# Patient Record
Sex: Male | Born: 1970 | Race: White | Hispanic: No | Marital: Married | Smoking: Never smoker
Health system: Southern US, Community
[De-identification: ages and names within clinical notes are randomized; demographics above are authoritative.]

## PROBLEM LIST (undated history)

## (undated) DIAGNOSIS — R5383 Other fatigue: Secondary | ICD-10-CM

## (undated) DIAGNOSIS — R0981 Nasal congestion: Secondary | ICD-10-CM

## (undated) DIAGNOSIS — G5600 Carpal tunnel syndrome, unspecified upper limb: Secondary | ICD-10-CM

## (undated) DIAGNOSIS — R0683 Snoring: Secondary | ICD-10-CM

## (undated) DIAGNOSIS — R002 Palpitations: Secondary | ICD-10-CM

## (undated) DIAGNOSIS — I10 Essential (primary) hypertension: Secondary | ICD-10-CM

## (undated) DIAGNOSIS — K219 Gastro-esophageal reflux disease without esophagitis: Secondary | ICD-10-CM

## (undated) HISTORY — DX: Carpal tunnel syndrome, unspecified upper limb: G56.00

## (undated) HISTORY — DX: Gastro-esophageal reflux disease without esophagitis: K21.9

## (undated) HISTORY — DX: Essential (primary) hypertension: I10

## (undated) HISTORY — DX: Snoring: R06.83

## (undated) HISTORY — DX: Nasal congestion: R09.81

## (undated) HISTORY — DX: Other fatigue: R53.83

## (undated) HISTORY — DX: Palpitations: R00.2

---

## 2018-12-24 ENCOUNTER — Ambulatory Visit (INDEPENDENT_AMBULATORY_CARE_PROVIDER_SITE_OTHER): Payer: Self-pay | Admitting: Cardiology

## 2019-01-04 ENCOUNTER — Other Ambulatory Visit: Payer: Self-pay | Admitting: Cardiology

## 2019-01-04 DIAGNOSIS — R0602 Shortness of breath: Secondary | ICD-10-CM

## 2019-01-13 ENCOUNTER — Ambulatory Visit
Admission: RE | Admit: 2019-01-13 | Discharge: 2019-01-13 | Disposition: A | Discharge status: Home / Self Care | Payer: BC Managed Care – PPO | Source: Ambulatory Visit | Attending: Cardiology | Admitting: Cardiology

## 2019-01-13 DIAGNOSIS — I493 Ventricular premature depolarization: Secondary | ICD-10-CM | POA: Insufficient documentation

## 2019-01-13 DIAGNOSIS — R0602 Shortness of breath: Secondary | ICD-10-CM

## 2019-01-13 DIAGNOSIS — I77810 Thoracic aortic ectasia: Secondary | ICD-10-CM | POA: Insufficient documentation

## 2019-01-13 DIAGNOSIS — R42 Dizziness and giddiness: Secondary | ICD-10-CM | POA: Insufficient documentation

## 2019-02-02 ENCOUNTER — Ambulatory Visit (INDEPENDENT_AMBULATORY_CARE_PROVIDER_SITE_OTHER): Payer: Self-pay | Admitting: Cardiovascular Disease

## 2020-11-20 LAB — HM COLONOSCOPY

## 2021-02-05 ENCOUNTER — Other Ambulatory Visit: Payer: Self-pay | Admitting: Gastroenterology

## 2021-03-14 NOTE — H&P (Signed)
[facility] OFFICE  [address], [place], [place] [address]     Anthony Mays    Date of Visit:  12/24/2018  Date of Birth: June 30, 1971  Age: 50 yrs.   Medical Record Number: [id number]  Referring Physician: Nolen Mu MD, [place] Lighter  __   CURRENT DIAGNOSES     1. 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 Shortness of breath, R06.02  3. Dizziness, vertigo, R42  __  ALLERGIES     No Known Allergies  __  MEDICATIONS     1. coenzyme Q10 10 mg capsule, 1 po qd  __   CHIEF COMPLAINT/REASON FOR VISIT  Dizziness, Palpitations and Shortness of breath  __  HISTORY OF PRESENT ILLNESS   Anthony Mays is a 50 year old gentleman, on whom we were asked to consult by Dr. Nolen Mu, for evaluation of shortness of breath and lightheadedness.     Anthony Mays had been seeing Dr. Quita Skye in [place], [place], for years. Primarily  this was done for palpitations. A Holter monitor did reveal PVCs. Workups including multiple stress echocardiograms have always been unremarkable. The last echocardiogram did reveal mildly dilated left ventricle, but overall normal left ventricular function.  Aortic root was also mildly enlarged.    Recently, Anthony Mays has been noticing some shortness of breath and lightheadedness when he tries to push himself during exercise. This actually began seven months ago. He has really stopped exercising  because of these symptoms. He will exercise and then feel lightheaded and short of breath. He will slow down or stop and the symptoms disappear. There is no pain per se. He has no paroxysmal nocturnal dyspnea or orthopnea. He does have occasional palpitations,  but they are much improved on magnesium oxide that was prescribed by Dr. Onalee Hua.  __  PAST HISTORY      Past Medical Illnesses: No previous history of significant medical illnesses.;  Past Cardiac Illnesses : No previous history of cardiac disease.; Infectious Diseases: No previous history of significant infectious diseases.;   Surgical Procedures: No previous surgical procedures.; Trauma History: No previous history of significant  trauma.; Cardiology Procedures-Invasive: No previous interventional or invasive cardiology procedures.;  Cardiology Procedures-Noninvasive: Holter Monitor December 2012, Echocardiogram December 2012, Echocardiogram August 2013, Echocardiogram January 2014, Stress Echocardiogram January 2013;  Left Ventricular Ejection Fraction: LVEF of 60% documented via echocardiogram on 01/05/2013; Peripheral Vascular Procedures : Carotid doppler 01/16/2012, LE arterial doppler 01/2012    __  CARDIAC RISK FACTORS      Tobacco Abuse: has never used tobacco; Family History of Heart Disease: no family history of cardiovascular  disease; Hyperlipidemia: negative; Hypertension: negative;   Diabetes Mellitus: negative; Prior History of Heart Disease : negative; Obesity: negative; Sedentary Life Style :negative; [id number]  __  SOCIAL HISTORY     Alcohol Use: Does not use alcohol; Smoking: Does not smoke; Never smoker ([id number]);  Diet: Regular diet; Exercise: No regular exercise;   __   REVIEW OF SYSTEMS    General: Denies recent weight  loss, weight gain, fever or chills or change in exercise tolerance.; Integumentary: Denies any change  in hair or nails, rashes, or skin lesions.; Eyes: Denies diplopia, glaucoma or visual field defects.;  Ears, Nose, Throat, Mouth: Denies any hearing loss, epistaxis, hoarseness or difficulty speaking.;Respiratory : Denies dyspnea, cough, wheezing or hemoptysis.; Cardiovascular:  Please review HPI; Abdominal : Denies ulcer disease, hematochezia or melena.; Musculoskeletal:Denies any venous insufficiency, arthritic symptoms or back problems.; Neurological  : Denies any recurrent strokes, TIA, or seizure disorder.;  Psychiatric:  Denies any depression, substance abuse or change in cognitive functions.; Endocrine: Denies any weight  change, heat/cold intolerance, polydipsia, or polyuria;  Hematologic/Immunologic: Denies any food allergies,  seasonal allergies, bleeding disorders.  __  PHYSICAL EXAMINATION    Vital Signs :  Blood Pressure:  124/68 Supine, Left arm, regular cuff  118/72 Sitting, Left arm, regular cuff  120/76 Standing, Left arm, regular cuff     Weight: 221.00 lbs.  Height: 74"  BMI:  28.37   Pulse: 63/min. ekg       Constitutional:  Cooperative, alert and oriented,well developed, well nourished, in no acute distress. Skin: warm and dry to touch  Head: normocephalic Eyes: conjunctivae and lids normal  ENT: No pallor or cyanosis Neck:  no JVD, no bruits Chest: clear to auscultation bilaterally Cardiac : Regular rhythm, S1 normal, S2 normal, No S3 or S4, Apical impulse not displaced, no murmurs, gallops or rubs detected. Abdomen: Abdomen soft, bowel sounds  normoactive, no masses, no hepatosplenomegaly, non-tender, no bruits Peripheral Pulses: The femoral,  popliteal, dorsalis pedis, and posterior tibial pulses are full and equal bilaterally with no bruits auscultated. Extremities/Back: No deformities, clubbing,  cyanosis, erythema or edema observed. There are no spinal abnormalities noted. Normal muscle strength and tone. Neurological:  No gross motor or sensory deficits noted, affect appropriate, oriented to time, person and place.   __    Medications added today by the physician:    ECG:   Normal.    IMPRESSION:  Vague lightheadedness and shortness of breath of uncertain etiology. I do wonder whether or not he was becoming increasingly  vagal towards peak exercise.     RECOMMENDATIONS:  1. Echocardiogram in view of his shortness of breath.  2. Treadmill stress test to see if  we can recreate any of his symptoms.   3. If these tests are unremarkable, he will be reassured. At that point however, we may need to consider more long-term monitoring if his symptoms persist.     Jodelle Gross, MD, [facility]    CMM/bjo     cc: Smith Mince MD  Quita Skye MD  ____________________________  TODAYS  ORDERS  2D, color flow, doppler 1 week  Diet mgmt edu, guidance  and counseling TODAY  Lipid Panel 1 week  12 Lead ECG Today  Exercise Treadmill Bruce 1 week

## 2021-08-28 ENCOUNTER — Other Ambulatory Visit (HOSPITAL_BASED_OUTPATIENT_CLINIC_OR_DEPARTMENT_OTHER): Payer: Self-pay

## 2021-08-28 ENCOUNTER — Ambulatory Visit: Payer: BC Managed Care – PPO | Attending: Internal Medicine

## 2021-08-28 DIAGNOSIS — Z23 Encounter for immunization: Secondary | ICD-10-CM

## 2021-08-28 MED ORDER — INFLUENZA VAC SPLIT QUAD 0.5 ML IM SUSY
PREFILLED_SYRINGE | INTRAMUSCULAR | 0 refills | Status: DC
  Filled 2021-08-28: qty 0.5, 1d supply, fill #0

## 2021-08-28 NOTE — Progress Notes (Signed)
   Covid-19 Vaccination Clinic  Name:  Macai Sisneros    MRN: [id number] DOB: 11-14-1971  08/28/2021  Mr. Wray was observed post Covid-19 immunization for 15 minutes without incident. He was provided with Vaccine Information Sheet and instruction to access the V-Safe system.   Mr. Lantzy was instructed to call 911 with any severe reactions post vaccine: Difficulty breathing  Swelling of face and throat  A fast heartbeat  A bad rash all over body  Dizziness and weakness

## 2021-08-30 ENCOUNTER — Telehealth: Payer: BC Managed Care – PPO | Admitting: Physician Assistant

## 2021-08-30 ENCOUNTER — Telehealth: Payer: Self-pay

## 2021-08-30 DIAGNOSIS — G8929 Other chronic pain: Secondary | ICD-10-CM

## 2021-08-30 DIAGNOSIS — B351 Tinea unguium: Secondary | ICD-10-CM

## 2021-08-30 DIAGNOSIS — M25562 Pain in left knee: Secondary | ICD-10-CM

## 2021-08-30 NOTE — Progress Notes (Signed)
Based on what you shared with me, I feel your condition warrants further evaluation and I recommend that you be seen in a face to face visit. E-visits are intended for simple acute visits and so what we can treat without examining you is limited.   With the ongoing back pain an d"popping" in lower back this could be muscular but concern for worsening of slipped discs giving the tingling in posterior thigh with this. Nerve compression in the spine can have an impact on urination and bowel movement -- either making it easier or harder to go, causing incontinence, etc when severe. As such you need further evaluation in person.   For the nail fungus. Topical treatments for this are overall unsuccessful and it usually takes a couple of months of daily oral medication to clear this up. This requires assessment of your liver enzymes to make sure it is safe to prescribe these medications. As such, this is something that a primary care provider or podiatrist would take care of. Casey Carson    NOTE: There will be NO CHARGE for this eVisit   If you are having a true medical emergency please call 911.      For an urgent face to face visit, [place] has six urgent care centers for your convenience:     [facility] at [facility] Directions [phone] [address] [place], [place] [address]    [facility] Health Urgent [facility] Center [facility]) Get Driving Directions [phone] [address] [place], [place] [address]  [facility] Urgent Care Center [facility]) Get Driving Directions [phone] [address] [place],  [place]  [address]  [facility] Health Urgent Care at [facility] Get Driving Directions [phone] [address] [place], [place] [address]   [facility] Health Urgent Care at [facility] Get Driving Directions  [phone] [address] [place], [place] [address]   [facility]  Urgent Care at [facility] Directions [phone] [address] [place], [place] [address]  Your MyChart E-visit questionnaire answers were reviewed by a board certified advanced clinical practitioner to complete your personal care plan based on your specific symptoms.  Thank you for using e-Visits.

## 2021-09-04 ENCOUNTER — Other Ambulatory Visit (HOSPITAL_BASED_OUTPATIENT_CLINIC_OR_DEPARTMENT_OTHER): Payer: Self-pay

## 2021-09-04 MED ORDER — COVID-19MRNA BIVAL VACC PFIZER 30 MCG/0.3ML IM SUSP
INTRAMUSCULAR | 0 refills | Status: DC
  Filled 2021-09-04: qty 0.3, 1d supply, fill #0

## 2021-09-12 ENCOUNTER — Encounter: Payer: Self-pay | Admitting: Family Medicine

## 2021-09-12 ENCOUNTER — Ambulatory Visit (INDEPENDENT_AMBULATORY_CARE_PROVIDER_SITE_OTHER): Payer: BC Managed Care – PPO | Admitting: Family Medicine

## 2021-09-12 VITALS — Ht 77.0 in | Wt 230.0 lb

## 2021-09-12 DIAGNOSIS — M5416 Radiculopathy, lumbar region: Secondary | ICD-10-CM | POA: Diagnosis not present

## 2021-09-12 MED ORDER — PREDNISONE 5 MG PO TABS: ORAL_TABLET | ORAL | 0 refills | Status: DC

## 2021-09-12 NOTE — Progress Notes (Signed)
  Casey Carson - 50 y.o. male MRN [id number]  Date of birth: [birth year]  SUBJECTIVE:  Including CC & ROS.  No chief complaint on file.   Casey Carson is a 50 y.o. male that is presenting with acute on chronic left leg pain.  It is occurring over the lateral aspect and goes to his foot.  No injury or inciting event.  Has tried ibuprofen.   Review of Systems See HPI   HISTORY: Past Medical, Surgical, Social, and Family History Reviewed & Updated per EMR.   Pertinent Historical Findings include:  History reviewed. No pertinent past medical history.  History reviewed. No pertinent surgical history.  History reviewed. No pertinent family history.  Social History   Socioeconomic History   Marital status: Married    Spouse name: Not on file   Number of children: Not on file   Years of education: Not on file   Highest education level: Not on file  Occupational History   Not on file  Tobacco Use   Smoking status: Not on file   Smokeless tobacco: Not on file  Substance and Sexual Activity   Alcohol use: Not on file   Drug use: Not on file   Sexual activity: Not on file  Other Topics Concern   Not on file  Social History Narrative   Not on file   Social Determinants of Health   Financial Resource Strain: Not on file  Food Insecurity: Not on file  Transportation Needs: Not on file  Physical Activity: Not on file  Stress: Not on file  Social Connections: Not on file  Intimate Partner Violence: Not on file     PHYSICAL EXAM:  VS: Ht 6\' 5"  (1.956 m)   Wt 230 lb (104.3 kg)   BMI 27.27 kg/m  Physical Exam Gen: NAD, alert, cooperative with exam, well-appearing    ASSESSMENT & PLAN:   Lumbar radiculopathy Symptoms seem most consistent with radicular type pain.  Has good range of motion and some instability with hip flexion and abduction. -Counseled on home exercise therapy and supportive care. -Prednisone. -X-ray. -Referral to physical  therapy. -Could consider gabapentin.

## 2021-09-12 NOTE — Assessment & Plan Note (Signed)
Symptoms seem most consistent with radicular type pain.  Has good range of motion and some instability with hip flexion and abduction. -Counseled on home exercise therapy and supportive care. -Prednisone. -X-ray. -Referral to physical therapy. -Could consider gabapentin.

## 2021-09-12 NOTE — Patient Instructions (Signed)
Nice to meet you Please try heat  Please try the exercises  Please try physical therapy  I will call with the xray results.   Please send me a message in MyChart with any questions or updates.  Please see me back in 4 weeks.   --Dr. Jordan Likes

## 2021-09-14 ENCOUNTER — Ambulatory Visit: Payer: BC Managed Care – PPO | Admitting: Podiatry

## 2021-09-14 ENCOUNTER — Other Ambulatory Visit: Payer: Self-pay

## 2021-09-14 ENCOUNTER — Ambulatory Visit (HOSPITAL_BASED_OUTPATIENT_CLINIC_OR_DEPARTMENT_OTHER)
Admission: RE | Admit: 2021-09-14 | Discharge: 2021-09-14 | Disposition: A | Discharge status: Home / Self Care | Payer: BC Managed Care – PPO | Source: Ambulatory Visit | Attending: Family Medicine | Admitting: Family Medicine

## 2021-09-14 DIAGNOSIS — M5117 Intervertebral disc disorders with radiculopathy, lumbosacral region: Secondary | ICD-10-CM | POA: Diagnosis not present

## 2021-09-14 DIAGNOSIS — M4726 Other spondylosis with radiculopathy, lumbar region: Secondary | ICD-10-CM | POA: Diagnosis not present

## 2021-09-14 DIAGNOSIS — M4696 Unspecified inflammatory spondylopathy, lumbar region: Secondary | ICD-10-CM | POA: Diagnosis not present

## 2021-09-14 DIAGNOSIS — M5116 Intervertebral disc disorders with radiculopathy, lumbar region: Secondary | ICD-10-CM | POA: Diagnosis not present

## 2021-09-14 DIAGNOSIS — M5416 Radiculopathy, lumbar region: Secondary | ICD-10-CM | POA: Diagnosis not present

## 2021-09-15 ENCOUNTER — Encounter: Payer: Self-pay | Admitting: Family Medicine

## 2021-09-15 ENCOUNTER — Ambulatory Visit: Payer: BC Managed Care – PPO | Admitting: Family Medicine

## 2021-09-18 ENCOUNTER — Telehealth: Payer: Self-pay | Admitting: Family Medicine

## 2021-09-18 NOTE — Telephone Encounter (Signed)
Informed of results.   Myra Rude, MD [facility] 09/18/2021, 11:48 AM

## 2021-09-19 ENCOUNTER — Other Ambulatory Visit: Payer: Self-pay

## 2021-09-19 ENCOUNTER — Ambulatory Visit (INDEPENDENT_AMBULATORY_CARE_PROVIDER_SITE_OTHER): Payer: BC Managed Care – PPO | Admitting: Podiatry

## 2021-09-19 ENCOUNTER — Encounter: Payer: Self-pay | Admitting: Podiatry

## 2021-09-19 DIAGNOSIS — B351 Tinea unguium: Secondary | ICD-10-CM

## 2021-09-19 MED ORDER — TERBINAFINE HCL 250 MG PO TABS: ORAL_TABLET | ORAL | 0 refills | Status: DC

## 2021-09-20 NOTE — Progress Notes (Signed)
Subjective:   Patient ID: Casey Carson, male   DOB: 50 y.o.   MRN: [id number]   HPI Patient presents stating he has had discoloration of his big toenails left over right times several months and also get some mild discomfort on the left big toe.  Admits he was quite active and that it may have occurred after some activity and patient does not currently smoke does like to be active   Review of Systems  All other systems reviewed and are negative.      Objective:  Physical Exam Vitals and nursing note reviewed.  Constitutional:      Appearance: He is well-developed.  Pulmonary:     Effort: Pulmonary effort is normal.  Musculoskeletal:        General: Normal range of motion.  Skin:    General: Skin is warm.  Neurological:     Mental Status: He is alert.    Neurovascular status intact muscle strength was found to be adequate range of motion adequate with slight distal yellowness and thickening of the hallux nail left over right but no proximal or mid nail involvement.  Patient has good digital perfusion no erythema edema or drainage noted     Assessment:  Possibility for low-grade fungal infection left big toenail over right versus trauma which may have occurred     Plan:  H&P reviewed both conditions.  I discussed trauma and at this point I have recommended that he start a low dose pulse antifungal therapy and allow it to grow out and if it becomes worse we will initiate laser or possible more aggressive oral antifungal therapy.  Placed on pulse pack and encouraged to call if symptoms do not get better over the next 4 to 6 months

## 2021-09-24 ENCOUNTER — Ambulatory Visit: Payer: BC Managed Care – PPO | Attending: Family Medicine | Admitting: Physical Therapy

## 2021-09-24 ENCOUNTER — Other Ambulatory Visit: Payer: Self-pay

## 2021-09-24 ENCOUNTER — Encounter: Payer: Self-pay | Admitting: Physical Therapy

## 2021-09-24 DIAGNOSIS — M62838 Other muscle spasm: Secondary | ICD-10-CM

## 2021-09-24 DIAGNOSIS — M5416 Radiculopathy, lumbar region: Secondary | ICD-10-CM | POA: Insufficient documentation

## 2021-09-24 DIAGNOSIS — M6281 Muscle weakness (generalized): Secondary | ICD-10-CM | POA: Diagnosis not present

## 2021-09-24 DIAGNOSIS — M5442 Lumbago with sciatica, left side: Secondary | ICD-10-CM

## 2021-09-24 NOTE — Therapy (Signed)
[facility] [address]  [address] [place], [place], [address] Phone: [phone]   Fax:  [phone]  Physical Therapy Evaluation  Patient Details  Name: Casey Carson MRN: [id number] Date of Birth: July 15, 1971 Referring Provider (PT): Clare Gandy   Encounter Date: 09/24/2021   PT End of Session - 09/24/21 1716     Visit Number 1    Number of Visits 12    Date for PT Re-Evaluation 11/05/21    PT Start Time 1625    PT Stop Time 1710    PT Time Calculation (min) 45 min    Activity Tolerance Patient tolerated treatment well    Behavior During Therapy [facility] for tasks assessed/performed             History reviewed. No pertinent past medical history.  History reviewed. No pertinent surgical history.  There were no vitals filed for this visit.    Subjective Assessment - 09/24/21 1627     Subjective Patient reports a few years of pain, but worsened over the last six months, used to be able to ignore it but now every day reminded that something is not quite right.  Before it only bothered him if sitting wrong or long car ride.  Now has a little bit of tingling and pain down his Left leg.    Pertinent History GI issues    Limitations Sitting    How long can you sit comfortably? 1.5 hours    How long can you stand comfortably? no limitations    How long can you walk comfortably? no limitation walking level surfaces    Diagnostic tests Xrays 09/14/21 - lumbar IMPRESSION:  Mild to moderate multilevel degenerative disc disease, worst at  L4-L5 and L5-S1.    Patient Stated Goals be able to go on hikes, more biking, maybe start TKD, feel his core is strong enough doesn't have to be concerned about back issue anymore    Currently in Pain? Yes    Pain Score 2     Pain Location Back    Pain Orientation Left;Lower    Pain Descriptors / Indicators Tingling;Aching;Dull    Pain Type Chronic pain    Pain Radiating  Towards down LLE back of thigh to calf, sometimes to lateral aspect of foot    Pain Onset Other (comment)   6 months   Pain Frequency Intermittent    Aggravating Factors  going up and down stairs, transitional movements, driving for hours at a time (goes numb)    Pain Relieving Factors gentle exercise and movement    Effect of Pain on Daily Activities reluctant to try new activities                [facility] PT Assessment - 09/24/21 0001       Assessment   Medical Diagnosis Lumbar radiculopathy    Referring Provider (PT) Clare Gandy    Onset Date/Surgical Date --   6 months   Hand Dominance Right    Next MD Visit 10/12/2021      Precautions   Precautions None      Restrictions   Weight Bearing Restrictions No      Balance Screen   Has the patient fallen in the past 6 months No    Has the patient had a decrease in activity level because of a fear of falling?  No    Is the patient reluctant to leave their home because of a fear  of falling?  No      Prior Function   Level of Independence Independent    Vocation Full time employment    Vocation Requirements computer support, prolonged sitting    Leisure hiking, biking, wants to start TKD      Cognition   Overall Cognitive Status Within Functional Limits for tasks assessed      Observation/Other Assessments   Focus on Therapeutic Outcomes (FOTO)  lumbar 71 (risk adjusted 55)      Sensation   Light Touch Appears Intact      ROM / Strength   AROM / PROM / Strength AROM;PROM;Strength      AROM   Overall AROM  Within functional limits for tasks performed    Overall AROM Comments standing    AROM Assessment Site Lumbar    Lumbar Flexion can touch toes    Lumbar Extension full, tightness in L side low back    Lumbar - Right Side Bend to knee, pulling in L side low back    Lumbar - Left Side Bend to knee      PROM   Overall PROM  Within functional limits for tasks performed    Overall PROM Comments good hip ROM for  IR/ER, symmetric      Strength   Overall Strength Within functional limits for tasks performed    Overall Strength Comments 5/5 bil LE strength all myotomes      Flexibility   Soft Tissue Assessment /Muscle Length yes    Hamstrings WNL, can touch toes    Quadriceps tightness with prone knee bend      Special Tests   Other special tests negative FABER bil, negative hip scour test L, negative SLR bil, positive supine to long sit test                        Objective measurements completed on examination: See above findings.       [facility] Adult PT Treatment/Exercise - 09/24/21 0001       Lumbar Exercises: Stretches   Prone on Elbows Stretch 1 rep;60 seconds    Prone on Elbows Stretch Limitations tightness, no pain      Manual Therapy   Manual Therapy Muscle Energy Technique    Manual therapy comments in supine    Muscle Energy Technique resisted L hip extension, 2 rounds of 5 x 6 sec hold, to correct pelvic obliquity.                       PT Short Term Goals - 09/24/21 1726       PT SHORT TERM GOAL #1   Title Pt. will be independent with initial HEP    Time 2    Period Weeks    Status New    Target Date 10/08/21      PT SHORT TERM GOAL #2   Title Pt. will report centralization of LLE pain/numbness    Time 4    Period Weeks    Status New    Target Date 10/22/21               PT Long Term Goals - 09/24/21 1726       PT LONG TERM GOAL #1   Title Pt. will be independent with progressed HEP for core strengthening    Time 6    Period Weeks    Status New    Target Date 11/05/21  PT LONG TERM GOAL #2   Title Pt. will report no low back/LLE pain at rest.    Time 6    Period Weeks    Status New    Target Date 11/05/21      PT LONG TERM GOAL #3   Title Pt. will report no limitations in activities or sleep due to low back/LLE pain.    Time 6    Period Weeks    Status New    Target Date 11/05/21                     Plan - 09/24/21 1717     Clinical Impression Statement Casey Carson is a 50 year old male referred for acute on chronic lumbar radiculopathy.  He reports history of chronic low back pain that worsened over the last 6 months.  He reports radicular symptoms radiating from left side low back down LLE to calf and sometimes to knee.  Today he demonstrated good strength, ROM, and no sensation differences between LE.  He did have positive supine to long sit test with posterior rotation of L innominate, corrected with muscle energy.  With resisted hip abduction also reported popping of L SIJ and decreased pressure in this joint.  His pain appears to be multifactorial, with possible lumbar radiculopathy along with SI joint dysfunction and possible piriformis syndrome.  Initial HEP for prone on elbows x 3 min given today.  He would benefit from skilled physical therapy to decrease LBP, centralize symptoms, and improve tolerance to activities.    Personal Factors and Comorbidities Comorbidity 2    Comorbidities chronic LBP, history L knee injury (cartilage damage, Osgood-Schlatter)    Examination-Activity Limitations Stairs;Transfers;Sleep    Examination-Participation Restrictions Driving;Community Activity    Stability/Clinical Decision Making Stable/Uncomplicated    Clinical Decision Making Low    Rehab Potential Excellent    PT Frequency 2x / week    PT Duration 6 weeks    PT Treatment/Interventions ADLs/Self Care Home Management;Moist Heat;Traction;Ultrasound;Gait training;Stair training;Functional mobility training;Therapeutic activities;Therapeutic exercise;Neuromuscular re-education;Patient/family education;Passive range of motion;Dry needling;Taping;Spinal Manipulations;Joint Manipulations    PT Next Visit Plan initiate core strengthening exercises, mckenzie lumbar extension exercise, recheck supine to long sit test, manual therapy, modalities PRN (possible dry needling)     Consulted and Agree with Plan of Care Patient             Patient will benefit from skilled therapeutic intervention in order to improve the following deficits and impairments:  Decreased activity tolerance, Decreased endurance, Increased fascial restricitons, Pain, Improper body mechanics, Decreased mobility, Increased muscle spasms, Impaired flexibility  Visit Diagnosis: Acute left-sided low back pain with left-sided sciatica  Muscle weakness (generalized)  Other muscle spasm     Problem List Patient Active Problem List   Diagnosis Date Noted   Lumbar radiculopathy 09/12/2021    Jena Gauss, PT, DPT 09/24/2021, 5:39 PM  [facility] [address]  [address] [place], [place], [address] Phone: [phone]   Fax:  [phone]  Name: Casey Carson MRN: [id number] Date of Birth: 1971/06/13

## 2021-10-05 ENCOUNTER — Encounter: Payer: Self-pay | Admitting: Physical Therapy

## 2021-10-05 ENCOUNTER — Ambulatory Visit: Payer: BC Managed Care – PPO | Admitting: Physical Therapy

## 2021-10-05 ENCOUNTER — Other Ambulatory Visit: Payer: Self-pay

## 2021-10-05 DIAGNOSIS — M62838 Other muscle spasm: Secondary | ICD-10-CM

## 2021-10-05 DIAGNOSIS — M5416 Radiculopathy, lumbar region: Secondary | ICD-10-CM | POA: Diagnosis not present

## 2021-10-05 DIAGNOSIS — M5442 Lumbago with sciatica, left side: Secondary | ICD-10-CM | POA: Diagnosis not present

## 2021-10-05 DIAGNOSIS — M6281 Muscle weakness (generalized): Secondary | ICD-10-CM

## 2021-10-05 NOTE — Patient Instructions (Signed)
Access Code: [email or web address] URL: [email or web address] Date: 10/05/2021 Prepared by: Harrie Foreman  Exercises Left Standing Lateral Shift Correction at Wall - Repetitions - 3 x daily - 7 x weekly - 1 sets - 10 reps Standing Lumbar Extension at Wall - Forearms - 3 x daily - 7 x weekly - 1 sets - 10 reps Prone Press Up - 1 x daily - 7 x weekly - 2 sets - 10 reps Beginner Prone Single Leg Raise - 1 x daily - 7 x weekly - 2 sets - 10 reps Supine Bridge - 1 x daily - 7 x weekly - 2 sets - 10 reps Single Leg Bridge - 1 x daily - 7 x weekly - 3 sets - 10 reps

## 2021-10-05 NOTE — Therapy (Signed)
[facility] [address]  [place], [place], [address] Phone: [phone]   Fax:  [phone]  Physical Therapy Treatment  Patient Details  Name: Casey Carson MRN: [id number] Date of Birth: 1971-05-13 Referring Provider (PT): Clearance Coots   Encounter Date: 10/05/2021   PT End of Session - 10/05/21 0856     Visit Number 2    Number of Visits 12    Date for PT Re-Evaluation 11/05/21    PT Start Time 0805    PT Stop Time 3662    PT Time Calculation (min) 42 min    Activity Tolerance Patient tolerated treatment well    Behavior During Therapy [facility] for tasks assessed/performed             History reviewed. No pertinent past medical history.  History reviewed. No pertinent surgical history.  There were no vitals filed for this visit.   Subjective Assessment - 10/05/21 0811     Subjective Patient reports that his back has not been bothering him as much lately.  He did have an incident of pain a few days ago with it radiating down his leg.  He signed up for [employer] and had some difficulty with some of the stretches.  He would like to work on core exercises today.    Patient Stated Goals be able to go on hikes, more biking, maybe start TKD, feel his core is strong enough doesn't have to be concerned about back issue anymore    Currently in Pain? No/denies                               [facility] Adult PT Treatment/Exercise - 10/05/21 0001       Exercises   Exercises Lumbar      Lumbar Exercises: Stretches   Press Ups 10 reps    Press Ups Limitations tighness in back, no pain      Lumbar Exercises: Aerobic   Tread Mill x 6 min 1.2 mph warm-up   no pain     Lumbar Exercises: Standing   Other Standing Lumbar Exercises side glides (L to wall) x 10    Other Standing Lumbar Exercises standing lumbar extensions leaning on wall x 10      Lumbar Exercises: Supine   Pelvic Tilt  10 reps    Pelvic Tilt Limitations cues for breathing and technique    Bridge 20 reps    Bridge Limitations with Tra contraction to initiate    Single Leg Bridge Limitations    Bridge with [employer] Limitations single leg on L side only 2 x 5 to correct pelvic rotation    Other Supine Lumbar Exercises shot gun 6 x 6 sec hold abduction/adduction isometric to stabilize SIJ      Lumbar Exercises: Prone   Straight Leg Raise 20 reps    Straight Leg Raises Limitations 2 x 10 bil   easier on R side to compared to L     Manual Therapy   Manual Therapy Joint mobilization;Soft tissue mobilization    Manual therapy comments in prone to low back to centralize symptoms and decrease muscle spasm/pain    Joint Mobilization L UPA mobs to lumbar spine (L4/5)    Soft tissue mobilization STM and MFR to L lumbar paraspinals, QL, piriformis and glute med  PT Education - 10/05/21 0854     Education Details Access Code: [email or web address]  HEP update.  Also texted to phone to use [employer] app.    Person(s) Educated Patient    Methods Explanation;Demonstration;Handout    Comprehension Verbalized understanding;Returned demonstration              PT Short Term Goals - 10/05/21 0955       PT SHORT TERM GOAL #1   Title Pt. will be independent with initial HEP    Time 2    Period Weeks    Status Achieved    Target Date 10/08/21      PT SHORT TERM GOAL #2   Title Pt. will report centralization of LLE pain/numbness    Time 4    Period Weeks    Status On-going    Target Date 10/22/21               PT Long Term Goals - 10/05/21 0955       PT LONG TERM GOAL #1   Title Pt. will be independent with progressed HEP for core strengthening    Time 6    Period Weeks    Status On-going   10/05/21- progressed HEP     PT LONG TERM GOAL #2   Title Pt. will report no low back/LLE pain at rest.    Time 6    Period Weeks    Status On-going      PT LONG TERM GOAL #3    Title Pt. will report no limitations in activities or sleep due to low back/LLE pain.    Time 6    Period Weeks    Status On-going                   Plan - 10/05/21 8893     Clinical Impression Statement Mr. Venhuizen reported overall improvement of symptoms and was compliant with initial HEP.  Today focused on progression of core exercises and also McKenzie based extension exercises to centralize symptoms.  He again had positive supine to long sit test which was addressed with single leg bridges followed by shotgun exercise, after which he felt more level in standing.  He would benefit from continued skilled therapy.    Personal Factors and Comorbidities Comorbidity 2    Comorbidities chronic LBP, history L knee injury (cartilage damage, Osgood-Schlatter)    Examination-Activity Limitations Stairs;Transfers;Sleep    Examination-Participation Restrictions Driving;Community Activity    Stability/Clinical Decision Making Stable/Uncomplicated    Rehab Potential Excellent    PT Frequency 2x / week    PT Duration 6 weeks    PT Treatment/Interventions ADLs/Self Care Home Management;Moist Heat;Traction;Ultrasound;Gait training;Stair training;Functional mobility training;Therapeutic activities;Therapeutic exercise;Neuromuscular re-education;Patient/family education;Passive range of motion;Dry needling;Taping;Spinal Manipulations;Joint Manipulations    PT Next Visit Plan review HEP, recheck supine to long sit test, manual therapy, modalities PRN (possible dry needling)    PT Home Exercise Plan [email or web address]    Consulted and Agree with Plan of Care Patient             Patient will benefit from skilled therapeutic intervention in order to improve the following deficits and impairments:  Decreased activity tolerance, Decreased endurance, Increased fascial restricitons, Pain, Improper body mechanics, Decreased mobility, Increased muscle spasms, Impaired flexibility  Visit Diagnosis: Acute  left-sided low back pain with left-sided sciatica  Muscle weakness (generalized)  Other muscle spasm     Problem List Patient Active Problem List   Diagnosis Date Noted  Lumbar radiculopathy 09/12/2021    Rennie Natter, PT, DPT 10/05/2021, 10:02 AM  [facility] [address]  [place], [place], [address] Phone: [phone]   Fax:  [phone]  Name: Jebediah Macrae MRN: [id number] Date of Birth: August 13, 1971

## 2021-10-10 ENCOUNTER — Other Ambulatory Visit: Payer: Self-pay

## 2021-10-10 ENCOUNTER — Ambulatory Visit: Payer: BC Managed Care – PPO | Attending: Family Medicine | Admitting: Physical Therapy

## 2021-10-10 ENCOUNTER — Encounter: Payer: Self-pay | Admitting: Physical Therapy

## 2021-10-10 DIAGNOSIS — M62838 Other muscle spasm: Secondary | ICD-10-CM | POA: Insufficient documentation

## 2021-10-10 DIAGNOSIS — M5442 Lumbago with sciatica, left side: Secondary | ICD-10-CM | POA: Insufficient documentation

## 2021-10-10 DIAGNOSIS — M6281 Muscle weakness (generalized): Secondary | ICD-10-CM | POA: Diagnosis not present

## 2021-10-10 NOTE — Patient Instructions (Signed)

## 2021-10-10 NOTE — Therapy (Signed)
[facility] [address]  [address] [place], [place], [address] Phone: [phone]   Fax:  [phone]  Physical Therapy Treatment  Patient Details  Name: Casey Carson MRN: [id number] Date of Birth: 10/18/1971 Referring Provider (PT): Clare Gandy   Encounter Date: 10/10/2021   PT End of Session - 10/10/21 0809     Visit Number 3    Number of Visits 12    Date for PT Re-Evaluation 11/05/21    PT Start Time 0805    PT Stop Time 0845    PT Time Calculation (min) 40 min    Activity Tolerance Patient tolerated treatment well    Behavior During Therapy [facility] for tasks assessed/performed             History reviewed. No pertinent past medical history.  History reviewed. No pertinent surgical history.  There were no vitals filed for this visit.   Subjective Assessment - 10/10/21 0805     Subjective Patient reports back is "not too bad" but hasn't been doing HEP.    Patient Stated Goals be able to go on hikes, more biking, maybe start TKD, feel his core is strong enough doesn't have to be concerned about back issue anymore    Currently in Pain? Yes    Pain Score 3     Pain Location Heel    Pain Orientation Left                               [facility] Adult PT Treatment/Exercise - 10/10/21 0001       Self-Care   Self-Care Other Self-Care Comments    Other Self-Care Comments  education on recommendations to avoid injury in TKD      Exercises   Exercises Lumbar      Lumbar Exercises: Stretches   Standing Extension 15 reps    Standing Extension Limitations to tolerance    Press Ups 10 reps    Press Ups Limitations tighness in back, no pain      Lumbar Exercises: Aerobic   Nustep L7 x 6 min      Lumbar Exercises: Prone   Straight Leg Raise 20 reps    Straight Leg Raises Limitations 2 x 10 bil    Other Prone Lumbar Exercises prone press-ups x 10      Manual Therapy   Manual  Therapy Joint mobilization;Soft tissue mobilization;Other (comment)    Manual therapy comments in prone to low back to centralize symptoms and decrease muscle spasm/pain    Joint Mobilization L UPA mobs to lumbar spine (L4/5)    Soft tissue mobilization STM and MFR to L lumbar paraspinals, QL, piriformis and glute med    Muscle Energy Technique dry needling              Trigger Point Dry Needling - 10/10/21 0001     Consent Given? Yes    Education Handout Provided Yes    Muscles Treated Back/Hip Lumbar multifidi    Dry Needling Comments L L3-L5 in prone    Lumbar multifidi Response Twitch response elicited;Palpable increased muscle length                   PT Education - 10/10/21 1058     Education Details education on dry needling, recommendations for stretches/kicks with TKD to avoid injury.    Person(s) Educated Patient    Methods Explanation;Demonstration;Handout  Comprehension Verbalized understanding;Returned demonstration              PT Short Term Goals - 10/10/21 0810       PT SHORT TERM GOAL #1   Title Pt. will be independent with initial HEP    Time 2    Period Weeks    Status Achieved    Target Date 10/08/21      PT SHORT TERM GOAL #2   Title Pt. will report centralization of LLE pain/numbness    Time 4    Period Weeks    Status On-going   10/10/2021- decreased frequency   Target Date 10/22/21               PT Long Term Goals - 10/10/21 1052       PT LONG TERM GOAL #1   Title Pt. will be independent with progressed HEP for core strengthening    Time 6    Period Weeks    Status On-going   10/05/21- progressed HEP  11/2- has not performed.     PT LONG TERM GOAL #2   Title Pt. will report no low back/LLE pain at rest.    Time 6    Period Weeks    Status On-going   10/10/21- no pain today     PT LONG TERM GOAL #3   Title Pt. will report no limitations in activities or sleep due to low back/LLE pain.    Time 6    Period  Weeks    Status On-going   10/10/21- started TKD                  Plan - 10/10/21 1053     Clinical Impression Statement Patient reports no pain in back today but does have same pain back of L heel at achilles insertion.  he has not been performing HEP.  Today reveiwed exercises, had more difficulty with L hip extension compared to R, and reported some L buttock pain after prone pressups.  This improved with L UPA mobs to lumbar spine.  He did demonstrate some trigger points in lumbar paraspinals, so trialed dry needling today, after which he did report feeling muscles much looser and less pulling with lumbar AROM.  He would benefit from continued skilled therapy.    Personal Factors and Comorbidities Comorbidity 2    Comorbidities chronic LBP, history L knee injury (cartilage damage, Osgood-Schlatter)    Examination-Activity Limitations Stairs;Transfers;Sleep    Examination-Participation Restrictions Driving;Community Activity    Stability/Clinical Decision Making Stable/Uncomplicated    Rehab Potential Excellent    PT Frequency 2x / week    PT Duration 6 weeks    PT Treatment/Interventions ADLs/Self Care Home Management;Moist Heat;Traction;Ultrasound;Gait training;Stair training;Functional mobility training;Therapeutic activities;Therapeutic exercise;Neuromuscular re-education;Patient/family education;Passive range of motion;Dry needling;Taping;Spinal Manipulations;Joint Manipulations    PT Next Visit Plan review HEP, recheck supine to long sit test, manual therapy, modalities PRN (possible dry needling)    PT Home Exercise Plan [id number]    Consulted and Agree with Plan of Care Patient             Patient will benefit from skilled therapeutic intervention in order to improve the following deficits and impairments:  Decreased activity tolerance, Decreased endurance, Increased fascial restricitons, Pain, Improper body mechanics, Decreased mobility, Increased muscle spasms, Impaired  flexibility  Visit Diagnosis: Acute left-sided low back pain with left-sided sciatica  Muscle weakness (generalized)  Other muscle spasm     Problem List Patient Active Problem  List   Diagnosis Date Noted   Lumbar radiculopathy 09/12/2021    Jena Gauss, PT, DPT 10/10/2021, 10:59 AM  [facility] [address]  [address] [place], [place], [address] Phone: [phone]   Fax:  [phone]  Name: Casey Carson MRN: [id number] Date of Birth: December 16, 1970

## 2021-10-12 ENCOUNTER — Ambulatory Visit: Payer: BC Managed Care – PPO | Admitting: Family Medicine

## 2021-10-12 NOTE — Progress Notes (Deleted)
  Casey Carson - 50 y.o. male MRN [id number]  Date of birth: May 30, 1971  SUBJECTIVE:  Including CC & ROS.  No chief complaint on file.   Casey Carson is a 50 y.o. male that is  ***.  ***   Review of Systems See HPI   HISTORY: Past Medical, Surgical, Social, and Family History Reviewed & Updated per EMR.   Pertinent Historical Findings include:  No past medical history on file.  No past surgical history on file.  No family history on file.  Social History   Socioeconomic History   Marital status: Married    Spouse name: Not on file   Number of children: Not on file   Years of education: Not on file   Highest education level: Not on file  Occupational History   Not on file  Tobacco Use   Smoking status: Not on file   Smokeless tobacco: Not on file  Substance and Sexual Activity   Alcohol use: Not on file   Drug use: Not on file   Sexual activity: Not on file  Other Topics Concern   Not on file  Social History Narrative   Not on file   Social Determinants of Health   Financial Resource Strain: Not on file  Food Insecurity: Not on file  Transportation Needs: Not on file  Physical Activity: Not on file  Stress: Not on file  Social Connections: Not on file  Intimate Partner Violence: Not on file     PHYSICAL EXAM:  VS: There were no vitals taken for this visit. Physical Exam Gen: NAD, alert, cooperative with exam, well-appearing MSK:  ***      ASSESSMENT & PLAN:   No problem-specific Assessment & Plan notes found for this encounter.

## 2021-10-16 ENCOUNTER — Ambulatory Visit: Payer: BC Managed Care – PPO

## 2021-10-18 ENCOUNTER — Other Ambulatory Visit: Payer: Self-pay

## 2021-10-18 ENCOUNTER — Encounter: Payer: Self-pay | Admitting: Physical Therapy

## 2021-10-18 ENCOUNTER — Ambulatory Visit: Payer: BC Managed Care – PPO | Admitting: Physical Therapy

## 2021-10-18 DIAGNOSIS — M6281 Muscle weakness (generalized): Secondary | ICD-10-CM | POA: Diagnosis not present

## 2021-10-18 DIAGNOSIS — M62838 Other muscle spasm: Secondary | ICD-10-CM

## 2021-10-18 DIAGNOSIS — M5442 Lumbago with sciatica, left side: Secondary | ICD-10-CM | POA: Diagnosis not present

## 2021-10-18 NOTE — Therapy (Signed)
[facility] [address]  [address] [place], [place], [address] Phone: [phone]   Fax:  [phone]  Physical Therapy Treatment  Patient Details  Name: Casey Carson MRN: [id number] Date of Birth: [birth year] Referring Provider (PT): Clare Gandy   Encounter Date: 10/18/2021   PT End of Session - 10/18/21 0810     Visit Number 4    Number of Visits 12    Date for PT Re-Evaluation 11/05/21    PT Start Time 0807    PT Stop Time 0845    PT Time Calculation (min) 38 min    Activity Tolerance Patient tolerated treatment well    Behavior During Therapy [facility] for tasks assessed/performed             History reviewed. No pertinent past medical history.  History reviewed. No pertinent surgical history.  There were no vitals filed for this visit.   Subjective Assessment - 10/18/21 0806     Subjective Patient reports back is doing better. every once and a while there is a pop but haven't noticed much tinglin or pain in leg or back.    Currently in Pain? Yes    Pain Score 1     Pain Location Back    Pain Orientation Left    Pain Descriptors / Indicators Discomfort                               [facility] Adult PT Treatment/Exercise - 10/18/21 0001       Exercises   Exercises Lumbar      Lumbar Exercises: Stretches   Standing Extension 10 reps    Standing Extension Limitations to tolerance, at wall      Lumbar Exercises: Aerobic   Elliptical L1 x 6 min      Lumbar Exercises: Standing   Other Standing Lumbar Exercises side glides (L to wall) x 10      Lumbar Exercises: Supine   Bridge 20 reps    Bridge Limitations with Tra contraction to initiate      Lumbar Exercises: Prone   Straight Leg Raise 20 reps    Straight Leg Raises Limitations 2 x 10 bil      Lumbar Exercises: Quadruped   Opposite Arm/Leg Raise Right arm/Left leg;Left arm/Right leg;20 reps      Manual Therapy    Manual Therapy Joint mobilization;Soft tissue mobilization;Other (comment)    Manual therapy comments in prone to low back to centralize symptoms and decrease muscle spasm/pain    Joint Mobilization L UPA mobs to lumbar spine (L4/5)    Soft tissue mobilization STM and MFR to L lumbar paraspinals, QL, piriformis and glute med    Muscle Energy Technique dry needling              Trigger Point Dry Needling - 10/18/21 0001     Consent Given? Yes    Education Handout Provided Previously provided    Muscles Treated Back/Hip Lumbar multifidi    Dry Needling Comments L L3-L5, R L3 in prone    Lumbar multifidi Response Twitch response elicited;Palpable increased muscle length                     PT Short Term Goals - 10/18/21 0810       PT SHORT TERM GOAL #1   Title Pt. will be independent with initial HEP  Time 2    Period Weeks    Status Achieved    Target Date 10/08/21      PT SHORT TERM GOAL #2   Title Pt. will report centralization of LLE pain/numbness    Time 4    Period Weeks    Status On-going   10/10/2021- decreased frequency  11/10- no numbness/tingling going down leg over last week, however did provoke tingling during session   Target Date 10/22/21               PT Long Term Goals - 10/18/21 0811       PT LONG TERM GOAL #1   Title Pt. will be independent with progressed HEP for core strengthening    Time 6    Period Weeks    Status On-going   10/05/21- progressed HEP  11/2- has not performed.     PT LONG TERM GOAL #2   Title Pt. will report no low back/LLE pain at rest.    Time 6    Period Weeks    Status On-going   10/10/21- no pain today     PT LONG TERM GOAL #3   Title Pt. will report no limitations in activities or sleep due to low back/LLE pain.    Time 6    Period Weeks    Status On-going   10/10/21- started TKD  11/10- does not note pain when wakes up now, increased activity level                  Plan - 10/18/21 1034      Clinical Impression Statement Patient reports not noticing tingling going down his L leg anymore, but some weakness on that side compard to R with higher level activities with TKD (i.e. kicking).  Today he demonstrated tenderness over L SIJ still and positive supine to long sit, which was easily corrected with single leg bridges on L x 3, however this increased tingling in plantar surface of foot.  Reviewed mckenzie extension exercises, and after these exercises and manual therapy to lumbar spine, reported decreased tingling and tightness.  He would benefit from continued skilled therapy.    Personal Factors and Comorbidities Comorbidity 2    Comorbidities chronic LBP, history L knee injury (cartilage damage, Osgood-Schlatter)    Examination-Activity Limitations Stairs;Transfers;Sleep    Examination-Participation Restrictions Driving;Community Activity    Stability/Clinical Decision Making Stable/Uncomplicated    Rehab Potential Excellent    PT Frequency 2x / week    PT Duration 6 weeks    PT Treatment/Interventions ADLs/Self Care Home Management;Moist Heat;Traction;Ultrasound;Gait training;Stair training;Functional mobility training;Therapeutic activities;Therapeutic exercise;Neuromuscular re-education;Patient/family education;Passive range of motion;Dry needling;Taping;Spinal Manipulations;Joint Manipulations    PT Next Visit Plan review HEP, recheck supine to long sit test, manual therapy, modalities PRN (possible dry needling)    PT Home Exercise Plan [id number]    Consulted and Agree with Plan of Care Patient             Patient will benefit from skilled therapeutic intervention in order to improve the following deficits and impairments:  Decreased activity tolerance, Decreased endurance, Increased fascial restricitons, Pain, Improper body mechanics, Decreased mobility, Increased muscle spasms, Impaired flexibility  Visit Diagnosis: Acute left-sided low back pain with left-sided  sciatica  Muscle weakness (generalized)  Other muscle spasm     Problem List Patient Active Problem List   Diagnosis Date Noted   Lumbar radiculopathy 09/12/2021    Jena Gauss, PT, DPT 10/18/2021, 10:38 AM  [facility] [address]  [address] [place], [place], [address] Phone: [phone]   Fax:  [phone]  Name: Krystian Younglove MRN: [id number] Date of Birth: 09/14/1971

## 2021-10-23 ENCOUNTER — Other Ambulatory Visit: Payer: Self-pay

## 2021-10-23 ENCOUNTER — Encounter: Payer: Self-pay | Admitting: Physical Therapy

## 2021-10-23 ENCOUNTER — Ambulatory Visit: Payer: BC Managed Care – PPO | Admitting: Physical Therapy

## 2021-10-23 DIAGNOSIS — M6281 Muscle weakness (generalized): Secondary | ICD-10-CM | POA: Diagnosis not present

## 2021-10-23 DIAGNOSIS — M5442 Lumbago with sciatica, left side: Secondary | ICD-10-CM | POA: Diagnosis not present

## 2021-10-23 DIAGNOSIS — M62838 Other muscle spasm: Secondary | ICD-10-CM | POA: Diagnosis not present

## 2021-10-23 NOTE — Therapy (Signed)
[facility] [address]  [address] [place], [place], [address] Phone: [phone]   Fax:  [phone]  Physical Therapy Treatment  Patient Details  Name: Casey Carson MRN: [id number] Date of Birth: July 31, 1971 Referring Provider (PT): Clare Gandy   Encounter Date: 10/23/2021   PT End of Session - 10/23/21 0901     Visit Number 5    Number of Visits 12    Date for PT Re-Evaluation 11/05/21    PT Start Time 0805    PT Stop Time 0850    PT Time Calculation (min) 45 min    Activity Tolerance Patient tolerated treatment well    Behavior During Therapy [facility] for tasks assessed/performed             History reviewed. No pertinent past medical history.  History reviewed. No pertinent surgical history.  There were no vitals filed for this visit.   Subjective Assessment - 10/23/21 0854     Subjective Patient reported the only incident of back pain this weekend was after sitting slouched in seat at theater for an hour.  He reports he continues to notice LLE weakness compared to RLE with single leg stance activities.  He is motivated to get trainer and start exercising more.    Currently in Pain? No/denies                               [facility] Adult PT Treatment/Exercise - 10/23/21 0001       Exercises   Exercises Lumbar      Lumbar Exercises: Stretches   Standing Side Bend Left    Standing Side Bend Limitations 2 x 10, corrected to only perform with left side to wall for side glide.    Standing Extension 10 reps    Standing Extension Limitations to tolerance, at wall    Press Ups 20 reps    Press Ups Limitations with overpressure to L lumbar by PT    Other Lumbar Stretch Exercise dynamic adductor stretches gradually increasing ROM      Lumbar Exercises: Aerobic   Elliptical L1 x 5 min      Lumbar Exercises: Machines for Strengthening   Leg Press 45# x 10 bil, 35# 1 x 8 LLE only,  25# 1 x 10 LLE      Lumbar Exercises: Standing   Other Standing Lumbar Exercises standing kicks 2 x 10 bil      Manual Therapy   Manual Therapy Joint mobilization;Soft tissue mobilization;Myofascial release    Manual therapy comments in prone to low back to centralize symptoms and decrease muscle spasm/pain    Joint Mobilization L UPA mobs to lumbar spine (L4/5)    Soft tissue mobilization STM and MFR to L lumbar paraspinals, QL, piriformis and glute med                       PT Short Term Goals - 10/18/21 0810       PT SHORT TERM GOAL #1   Title Pt. will be independent with initial HEP    Time 2    Period Weeks    Status Achieved    Target Date 10/08/21      PT SHORT TERM GOAL #2   Title Pt. will report centralization of LLE pain/numbness    Time 4    Period Weeks    Status On-going  10/10/2021- decreased frequency  11/10- no numbness/tingling going down leg over last week, however did provoke tingling during session   Target Date 10/22/21               PT Long Term Goals - 10/18/21 0811       PT LONG TERM GOAL #1   Title Pt. will be independent with progressed HEP for core strengthening    Time 6    Period Weeks    Status On-going   10/05/21- progressed HEP  11/2- has not performed.     PT LONG TERM GOAL #2   Title Pt. will report no low back/LLE pain at rest.    Time 6    Period Weeks    Status On-going   10/10/21- no pain today     PT LONG TERM GOAL #3   Title Pt. will report no limitations in activities or sleep due to low back/LLE pain.    Time 6    Period Weeks    Status On-going   10/10/21- started TKD  11/10- does not note pain when wakes up now, increased activity level                  Plan - 10/23/21 0901     Clinical Impression Statement Patient reports he still gets some tingling in L toes.  Today focused on progressing exercises focusing on more dynamic movements, motor control and SLS, as well as LLE strengthening.   Corrected side bends today.  He did report centralization of symptoms with prone pressups and overpressure followed by L UPA mobs.  He would benefit from continued skilled therapy.    Personal Factors and Comorbidities Comorbidity 2    Comorbidities chronic LBP, history L knee injury (cartilage damage, Osgood-Schlatter)    Examination-Activity Limitations Stairs;Transfers;Sleep    Examination-Participation Restrictions Driving;Community Activity    Stability/Clinical Decision Making Stable/Uncomplicated    Rehab Potential Excellent    PT Frequency 2x / week    PT Duration 6 weeks    PT Treatment/Interventions ADLs/Self Care Home Management;Moist Heat;Traction;Ultrasound;Gait training;Stair training;Functional mobility training;Therapeutic activities;Therapeutic exercise;Neuromuscular re-education;Patient/family education;Passive range of motion;Dry needling;Taping;Spinal Manipulations;Joint Manipulations    PT Next Visit Plan review HEP, recheck supine to long sit test, manual therapy, modalities PRN (possible dry needling)    PT Home Exercise Plan [email or web address]    Consulted and Agree with Plan of Care Patient             Patient will benefit from skilled therapeutic intervention in order to improve the following deficits and impairments:  Decreased activity tolerance, Decreased endurance, Increased fascial restricitons, Pain, Improper body mechanics, Decreased mobility, Increased muscle spasms, Impaired flexibility  Visit Diagnosis: Acute left-sided low back pain with left-sided sciatica  Muscle weakness (generalized)  Other muscle spasm     Problem List Patient Active Problem List   Diagnosis Date Noted   Lumbar radiculopathy 09/12/2021    Jena Gauss, PT, DPT 10/23/2021, 9:09 AM  [facility] [address]  [address] [place], [place], [address] Phone: [phone]   Fax:  [phone]  Name: Casey Carson MRN: [id number] Date of Birth: Apr 10, 1971

## 2021-10-26 ENCOUNTER — Ambulatory Visit: Payer: BC Managed Care – PPO | Admitting: Physical Therapy

## 2021-10-30 ENCOUNTER — Encounter: Payer: Self-pay | Admitting: Physical Therapy

## 2021-10-30 ENCOUNTER — Ambulatory Visit: Payer: BC Managed Care – PPO | Admitting: Physical Therapy

## 2021-10-30 ENCOUNTER — Other Ambulatory Visit: Payer: Self-pay

## 2021-10-30 DIAGNOSIS — M62838 Other muscle spasm: Secondary | ICD-10-CM

## 2021-10-30 DIAGNOSIS — M6281 Muscle weakness (generalized): Secondary | ICD-10-CM | POA: Diagnosis not present

## 2021-10-30 DIAGNOSIS — M5442 Lumbago with sciatica, left side: Secondary | ICD-10-CM

## 2021-10-30 NOTE — Patient Instructions (Signed)
Access Code: [email or web address] URL: [email or web address] Date: 10/30/2021 Prepared by: Harrie Foreman  Exercises Single Leg Bridge - 1 x daily - 7 x weekly - 3 sets - 10 reps Bird Dog - 1 x daily - 7 x weekly - 3 sets - 10 reps Child's Pose Stretch - 1 x daily - 7 x weekly - 1 sets - 3 reps - 30 sec hold The Diver - 1 x daily - 7 x weekly - 3 sets - 10 reps Squat with Chair Touch - 1 x daily - 7 x weekly - 3 sets - 10 reps Side Plank on Knees - 1 x daily - 7 x weekly - 1 sets - 3 reps - 30 sec hold

## 2021-10-30 NOTE — Therapy (Addendum)
PHYSICAL THERAPY DISCHARGE SUMMARY  Visits from Start of Care: 6  Current functional level related to goals / functional outcomes: Patient reports intermittant symptoms in L foot and toes, but overall improvement.  He has met or almost met all of his goals except for the tingling symptoms in his foot.  He has significantly increased his activity level and is now participating in TKD and planning on getting a trainer for gym.  Today progressed exercises significantly, including jefferson curls and squats with weights, RDLs, and side planks.  Exercise program updated.  His FOTO has improved to 75% (74% was predicted at discharge).  Based on his progress and activity level, he was placed on 30 day hold on 10/30/2021 to see if can continue to manage his symptoms while increasing his activity level.    Remaining deficits: See above   Education / Equipment: HEP  Plan: Patient agrees to discharge.   Patient is being discharged due to meeting the stated rehab goals.  Patient did not return within 30 day hold period.   Rennie Natter, PT, DPT 12/14/2021   [facility] [address]  [place], [place], [address] Phone: [phone]   Fax:  [phone]  Physical Therapy Treatment/Progress Note  Progress Note Reporting Period 09/24/2021 to 10/30/2021  See note below for Objective Data and Assessment of Progress/Goals.     Patient Details  Name: Casey Carson MRN: [id number] Date of Birth: [birth year] Referring Provider (PT): Clearance Coots   Encounter Date: 10/30/2021   PT End of Session - 10/30/21 0807     Visit Number 6    Number of Visits 12    Date for PT Re-Evaluation 11/05/21    PT Start Time 0803    PT Stop Time 0850    PT Time Calculation (min) 47 min    Activity Tolerance Patient tolerated treatment well    Behavior During Therapy [facility] for tasks assessed/performed             History  reviewed. No pertinent past medical history.  History reviewed. No pertinent surgical history.  There were no vitals filed for this visit.   Subjective Assessment - 10/30/21 0804     Subjective Patient reported he participatedin TKD last night and it was a lot more intensive than last session and he has some pain in low back.  Also a little pain in shoulder and knee, thinks its unconnected to back.  Has a little pain going down leg and numbness in toes, but not as bad as usual and again difficulty to isolate if from back or TKD    Currently in Pain? Yes    Pain Score 5     Pain Location Back    Pain Orientation Lower    Pain Descriptors / Indicators --   twinge now and then               [facility] PT Assessment - 10/30/21 0001       Assessment   Medical Diagnosis Lumbar radiculopathy    Referring Provider (PT) Clearance Coots    Hand Dominance Right      Precautions   Precautions None      Restrictions   Weight Bearing Restrictions No      Observation/Other Assessments   Focus on Therapeutic Outcomes (FOTO)  75  [facility] Adult PT Treatment/Exercise - 10/30/21 0001       Exercises   Exercises Lumbar      Lumbar Exercises: Stretches   Other Lumbar Stretch Exercise child pose stretch x 30 sec      Lumbar Exercises: Aerobic   Elliptical L1 x 6 min      Lumbar Exercises: Standing   Functional Squats 20 reps    Functional Squats Limitations 10 without weight, then 2 x 10 with 5lb press    Other Standing Lumbar Exercises RDLs 2 x 5 bil    Other Standing Lumbar Exercises Jefferson curls x 10 unweighted, x 10 with 5# weight      Lumbar Exercises: Sidelying   Other Sidelying Lumbar Exercises side plank 3 x 15 sec hold bil      Lumbar Exercises: Quadruped   Opposite Arm/Leg Raise Right arm/Left leg;Left arm/Right leg;20 reps    Opposite Arm/Leg Raise Limitations foam roller added to back for tactile cue for form to avoid hip  rotation      Manual Therapy   Manual Therapy Joint mobilization;Soft tissue mobilization    Manual therapy comments in prone to low back to centralize symptoms and decrease muscle spasm/pain    Joint Mobilization L UPA mobs to lumbar spine (L4/5)    Soft tissue mobilization STM and MFR to L lumbar paraspinals, QL, piriformis and glute med                     PT Education - 10/30/21 0912     Education Details HEP update Access Code: [email or web address]    Person(s) Educated Patient    Methods Explanation;Demonstration;Verbal cues;Other (comment)   patient uses app, declined handout today.   Comprehension Verbalized understanding;Returned demonstration              PT Short Term Goals - 10/18/21 0810       PT SHORT TERM GOAL #1   Title Pt. will be independent with initial HEP    Time 2    Period Weeks    Status Achieved    Target Date 10/08/21      PT SHORT TERM GOAL #2   Title Pt. will report centralization of LLE pain/numbness    Time 4    Period Weeks    Status On-going   10/10/2021- decreased frequency  11/10- no numbness/tingling going down leg over last week, however did provoke tingling during session   Target Date 10/22/21               PT Long Term Goals - 10/30/21 0900       PT LONG TERM GOAL #1   Title Pt. will be independent with progressed HEP for core strengthening    Time 6    Period Weeks    Status Achieved   10/05/21- progressed HEP  11/2- has not performed.  11/22- reports good compliance, requested update of core exercises.     PT LONG TERM GOAL #2   Title Pt. will report no low back/LLE pain at rest.    Time 6    Period Weeks    Status Partially Met   10/10/21- no pain today  11/22- increased pain after TKD, but overall improved LBP     PT LONG TERM GOAL #3   Title Pt. will report no limitations in activities or sleep due to low back/LLE pain.    Time 6    Period Weeks    Status Achieved  10/10/21- started TKD  11/10- does not note  pain when wakes up now, increased activity level  11/22- no limitations reported.                  Plan - 10/30/21 0859     Clinical Impression Statement Patient reports intermittant symptoms in L foot and toes, but overall improvement.  He has met or almost met all of his goals except for the tingling symptoms in his foot.  He has significantly increased his activity level and is now participating in TKD and planning on getting a trainer for gym.  Today progressed exercises significantly, including jefferson curls and squats with weights, RDLs, and side planks.  Exercise program updated.  His FOTO has improved to 75% (74% was predicted at discharge).  Based on his progress and activity level, he was placed on 30 day hold today to see if can continue to manage his symptoms while increasing his activity level.    Personal Factors and Comorbidities Comorbidity 2    Comorbidities chronic LBP, history L knee injury (cartilage damage, Osgood-Schlatter)    Examination-Activity Limitations Stairs;Transfers;Sleep    Examination-Participation Restrictions Driving;Community Activity    Stability/Clinical Decision Making Stable/Uncomplicated    Rehab Potential Excellent    PT Frequency 2x / week    PT Duration 6 weeks    PT Treatment/Interventions ADLs/Self Care Home Management;Moist Heat;Traction;Ultrasound;Gait training;Stair training;Functional mobility training;Therapeutic activities;Therapeutic exercise;Neuromuscular re-education;Patient/family education;Passive range of motion;Dry needling;Taping;Spinal Manipulations;Joint Manipulations    PT Next Visit Plan 30 day hold    PT Home Exercise Plan [email or web address]    Consulted and Agree with Plan of Care Patient             Patient will benefit from skilled therapeutic intervention in order to improve the following deficits and impairments:  Decreased activity tolerance, Decreased endurance, Increased fascial restricitons, Pain, Improper body  mechanics, Decreased mobility, Increased muscle spasms, Impaired flexibility  Visit Diagnosis: Acute left-sided low back pain with left-sided sciatica  Muscle weakness (generalized)  Other muscle spasm     Problem List Patient Active Problem List   Diagnosis Date Noted   Lumbar radiculopathy 09/12/2021    Rennie Natter, PT, DPT 10/30/2021, 9:13 AM  [facility] [address]  [place], [place], [address] Phone: [phone]   Fax:  [phone]  Name: Jerusalem Wert MRN: [id number] Date of Birth: 1971-09-21

## 2021-12-17 ENCOUNTER — Ambulatory Visit: Payer: BC Managed Care – PPO | Attending: Internal Medicine

## 2021-12-17 ENCOUNTER — Ambulatory Visit: Payer: BC Managed Care – PPO | Admitting: Medical

## 2021-12-17 DIAGNOSIS — Z23 Encounter for immunization: Secondary | ICD-10-CM

## 2021-12-20 ENCOUNTER — Other Ambulatory Visit (HOSPITAL_BASED_OUTPATIENT_CLINIC_OR_DEPARTMENT_OTHER): Payer: Self-pay

## 2021-12-20 MED ORDER — PFIZER COVID-19 VAC BIVALENT 30 MCG/0.3ML IM SUSP
INTRAMUSCULAR | 0 refills | Status: AC
  Filled 2021-12-20: qty 0.3, 1d supply, fill #0

## 2021-12-26 ENCOUNTER — Ambulatory Visit (INDEPENDENT_AMBULATORY_CARE_PROVIDER_SITE_OTHER): Payer: BC Managed Care – PPO | Admitting: Medical

## 2021-12-26 ENCOUNTER — Encounter: Payer: Self-pay | Admitting: Medical

## 2021-12-26 VITALS — BP 136/80 | HR 75 | Ht 77.0 in | Wt 245.9 lb

## 2021-12-26 DIAGNOSIS — K219 Gastro-esophageal reflux disease without esophagitis: Secondary | ICD-10-CM

## 2021-12-26 DIAGNOSIS — G8929 Other chronic pain: Secondary | ICD-10-CM

## 2021-12-26 DIAGNOSIS — Z0000 Encounter for general adult medical examination without abnormal findings: Secondary | ICD-10-CM

## 2021-12-26 DIAGNOSIS — M25562 Pain in left knee: Secondary | ICD-10-CM

## 2021-12-26 DIAGNOSIS — M5416 Radiculopathy, lumbar region: Secondary | ICD-10-CM

## 2021-12-26 DIAGNOSIS — J342 Deviated nasal septum: Secondary | ICD-10-CM

## 2021-12-26 DIAGNOSIS — R0981 Nasal congestion: Secondary | ICD-10-CM

## 2021-12-26 NOTE — Progress Notes (Signed)
Subjective:    Patient ID: Casey Carson, male    DOB: 1971-07-06, 51 y.o.   MRN: [id number]  HPI  Pt in for first time with me.   Pt moved to here from [place] and prior to that [employer]. Pt works with computers. Pt does not exercise on regular basis excpet from [facility]. Works from home. Eating moderate healthy. Pt does drink occasional rare beer or wine. Max 2 drinks a week and some weeks none. He does eat some fried foods though is vegitarian. Avoids fruit juices. One cup of coffee a day.   Back pain lumbar radicular pain and knee pain. He has seen sports med and PT.  Gerd- Pt has seen GI MD. He has used famotadine 40 mg in the past. Also had been on omeprzaole 40 daily.  He gets those over the counter. Pt states he did have egd in the past some evidence of reflux into esophagus.   In 2021 he had colonscopy. He states told was negative  Reports within last year psa was normal.  States had 2 shingrix vaccines.  Review of Systems  Constitutional:  Negative for chills, fatigue and fever.  HENT:  Positive for congestion. Negative for ear discharge, ear pain, facial swelling, hearing loss, postnasal drip and sinus pain.        Chronic nasal congestion and deviated septum.   Respiratory:  Negative for cough, chest tightness, shortness of breath and wheezing.   Cardiovascular:  Negative for chest pain and palpitations.  Gastrointestinal:  Negative for abdominal distention, anal bleeding, blood in stool, constipation, nausea and rectal pain.  Genitourinary:  Negative for dysuria, enuresis and frequency.  Musculoskeletal:  Negative for back pain, joint swelling and neck pain.  Skin:  Negative for rash.  Neurological:  Negative for tremors, facial asymmetry, speech difficulty and light-headedness.  Hematological:  Negative for adenopathy.  Psychiatric/Behavioral:  Negative for behavioral problems, decreased concentration and dysphoric mood. The patient is not  nervous/anxious.     No past medical history on file.   Social History   Socioeconomic History   Marital status: Married    Spouse name: Not on file   Number of children: Not on file   Years of education: Not on file   Highest education level: Not on file  Occupational History   Not on file  Tobacco Use   Smoking status: Not on file   Smokeless tobacco: Not on file  Substance and Sexual Activity   Alcohol use: Not on file   Drug use: Not on file   Sexual activity: Not on file  Other Topics Concern   Not on file  Social History Narrative   Not on file   Social Determinants of Health   Financial Resource Strain: Not on file  Food Insecurity: Not on file  Transportation Needs: Not on file  Physical Activity: Not on file  Stress: Not on file  Social Connections: Not on file  Intimate Partner Violence: Not on file    No past surgical history on file.  No family history on file.  No Known Allergies  Current Outpatient Medications on File Prior to Visit  Medication Sig Dispense Refill   Glucosamine-Chondroit-Vit C-Mn (GLUCOSAMINE 1500 COMPLEX PO) Take by mouth.     Lactobacillus-Inulin (CULTURELLE ADULT ULT BALANCE PO) Take by mouth.     magnesium gluconate (MAGONATE) 500 MG tablet Take 500 mg by mouth 2 (two) times daily.     Multiple Vitamin (MULTIVITAMIN) tablet Take 1  tablet by mouth daily.     Omega-3 Fatty Acids (OMEGA 3 500) 500 MG CAPS Take by mouth.     Turmeric 500 MG TABS Take by mouth.     COVID-19 mRNA bivalent vaccine, Pfizer, (PFIZER COVID-19 VAC BIVALENT) injection Inject into the muscle. 0.3 mL 0   No current facility-administered medications on file prior to visit.    BP 136/80    Pulse 75    Ht 6\' 5"  (1.956 m)    Wt 245 lb 14.4 oz (111.5 kg)    SpO2 99%    BMI 29.16 kg/m       Objective:   Physical Exam   General Mental Status- Alert. General Appearance- Not in acute distress.   Skin General: Color- Normal Color. Moisture- Normal  Moisture.  Neck Carotid Arteries- Normal color. Moisture- Normal Moisture. No carotid bruits. No JVD.  Chest and Lung Exam Auscultation: Breath Sounds:-Normal.  Cardiovascular Auscultation:Rythm- Regular. Murmurs & Other Heart Sounds:Auscultation of the heart reveals- No Murmurs.  Abdomen Inspection:-Inspeection Normal. Palpation/Percussion:Note:No mass. Palpation and Percussion of the abdomen reveal- Non Tender, Non Distended + BS, no rebound or guarding.  Neurologic Cranial Nerve exam:- CN III-XII intact(No nystagmus), symmetric smile. Strength:- 5/5 equal and symmetric strength both upper and lower extremities.      Assessment & Plan:   Patient Instructions  For you wellness exam today I have ordered cbc, cmp and  lipid panel.  Not sure if you are up to date on vaccines. Need to review your old records.  Recommend exercise and healthy diet.  We will let you know lab results as they come in.  Follow up date appointment will be determined after lab review.    For gerd continue healthy diet, ppi and famotadine. If you want second opinion referral to GI MD.  For back and knee pain continue with sports med and PT.   , PA-C

## 2021-12-26 NOTE — Patient Instructions (Addendum)
For you wellness exam today I have ordered cbc, cmp and  lipid panel.  Not sure if you are up to date on vaccines. Need to review your old records.  Recommend exercise and healthy diet.  We will let you know lab results as they come in.  Follow up date appointment will be determined after lab review.    For gerd continue healthy diet, ppi and famotadine. If you want second opinion referral to GI MD.  For back pain follow up with sports med. Will also refer for left knee pain.  Preventive Care 47-58 Years Old, Male Preventive care refers to lifestyle choices and visits with your health care provider that can promote health and wellness. Preventive care visits are also called wellness exams. What can I expect for my preventive care visit? Counseling During your preventive care visit, your health care provider may ask about your: Medical history, including: Past medical problems. Family medical history. Current health, including: Emotional well-being. Home life and relationship well-being. Sexual activity. Lifestyle, including: Alcohol, nicotine or tobacco, and drug use. Access to firearms. Diet, exercise, and sleep habits. Safety issues such as seatbelt and bike helmet use. Sunscreen use. Work and work Statistician. Physical exam Your health care provider will check your: Height and weight. These may be used to calculate your BMI (body mass index). BMI is a measurement that tells if you are at a healthy weight. Waist circumference. This measures the distance around your waistline. This measurement also tells if you are at a healthy weight and may help predict your risk of certain diseases, such as type 2 diabetes and high blood pressure. Heart rate and blood pressure. Body temperature. Skin for abnormal spots. What immunizations do I need? Vaccines are usually given at various ages, according to a schedule. Your health care provider will recommend vaccines for you based on your  age, medical history, and lifestyle or other factors, such as travel or where you work. What tests do I need? Screening Your health care provider may recommend screening tests for certain conditions. This may include: Lipid and cholesterol levels. Diabetes screening. This is done by checking your blood sugar (glucose) after you have not eaten for a while (fasting). Hepatitis B test. Hepatitis C test. HIV (human immunodeficiency virus) test. STI (sexually transmitted infection) testing, if you are at risk. Lung cancer screening. Prostate cancer screening. Colorectal cancer screening. Talk with your health care provider about your test results, treatment options, and if necessary, the need for more tests. Follow these instructions at home: Eating and drinking  Eat a diet that includes fresh fruits and vegetables, whole grains, lean protein, and low-fat dairy products. Take vitamin and mineral supplements as recommended by your health care provider. Do not drink alcohol if your health care provider tells you not to drink. If you drink alcohol: Limit how much you have to 0-2 drinks a day. Know how much alcohol is in your drink. In the [place] one drink equals one 12 oz bottle of beer (355 mL), one 5 oz glass of wine (148 mL), or one 1 oz glass of hard liquor (44 mL). Lifestyle Brush your teeth every morning and night with fluoride toothpaste. Floss one time each day. Exercise for at least 30 minutes 5 or more days each week. Do not use any products that contain nicotine or tobacco. These products include cigarettes, chewing tobacco, and vaping devices, such as e-cigarettes. If you need help quitting, ask your health care provider. Do not use drugs. If  you are sexually active, practice safe sex. Use a condom or other form of protection to prevent STIs. Take aspirin only as told by your health care provider. Make sure that you understand how much to take and what form to take. Work with your  health care provider to find out whether it is safe and beneficial for you to take aspirin daily. Find healthy ways to manage stress, such as: Meditation, yoga, or listening to music. Journaling. Talking to a trusted person. Spending time with friends and family. Minimize exposure to UV radiation to reduce your risk of skin cancer. Safety Always wear your seat belt while driving or riding in a vehicle. Do not drive: If you have been drinking alcohol. Do not ride with someone who has been drinking. When you are tired or distracted. While texting. If you have been using any mind-altering substances or drugs. Wear a helmet and other protective equipment during sports activities. If you have firearms in your house, make sure you follow all gun safety procedures. What's next? Go to your health care provider once a year for an annual wellness visit. Ask your health care provider how often you should have your eyes and teeth checked. Stay up to date on all vaccines. This information is not intended to replace advice given to you by your health care provider. Make sure you discuss any questions you have with your health care provider. Document Revised: 05/23/2021 Document Reviewed: 05/23/2021 Elsevier Patient Education  [place].

## 2021-12-27 LAB — COMPREHENSIVE METABOLIC PANEL
ALT: 16 U/L (ref 0–53)
AST: 23 U/L (ref 0–37)
Albumin: 4.5 g/dL (ref 3.5–5.2)
Alkaline Phosphatase: 55 U/L (ref 39–117)
BUN: 19 mg/dL (ref 6–23)
CO2: 28 mEq/L (ref 19–32)
Calcium: 8.9 mg/dL (ref 8.4–10.5)
Chloride: 103 mEq/L (ref 96–112)
Creatinine, Ser: 0.86 mg/dL (ref 0.40–1.50)
GFR: 99.3 mL/min (ref >60.00)
Glucose, Bld: 103 mg/dL — ABNORMAL HIGH (ref 70–99)
Potassium: 3.9 mEq/L (ref 3.5–5.1)
Sodium: 140 mEq/L (ref 135–145)
Total Bilirubin: 0.7 mg/dL (ref 0.2–1.2)
Total Protein: 7.1 g/dL (ref 6.0–8.3)

## 2021-12-27 LAB — CBC WITH DIFFERENTIAL/PLATELET
Basophils Absolute: 0 10*3/uL (ref 0.0–0.1)
Basophils Relative: 0.8 % (ref 0.0–3.0)
Eosinophils Absolute: 0.1 10*3/uL (ref 0.0–0.7)
Eosinophils Relative: 1.7 % (ref 0.0–5.0)
HCT: 40.8 % (ref 39.0–52.0)
Hemoglobin: 13.5 g/dL (ref 13.0–17.0)
Lymphocytes Relative: 38.4 % (ref 12.0–46.0)
Lymphs Abs: 2.1 10*3/uL (ref 0.7–4.0)
MCHC: 33 g/dL (ref 30.0–36.0)
MCV: 94.6 fl (ref 78.0–100.0)
Monocytes Absolute: 0.5 10*3/uL (ref 0.1–1.0)
Monocytes Relative: 10 % (ref 3.0–12.0)
Neutro Abs: 2.6 10*3/uL (ref 1.4–7.7)
Neutrophils Relative %: 49.1 % (ref 43.0–77.0)
Platelets: 176 10*3/uL (ref 150.0–400.0)
RBC: 4.32 Mil/uL (ref 4.22–5.81)
RDW: 14.2 % (ref 11.5–15.5)
WBC: 5.4 10*3/uL (ref 4.0–10.5)

## 2021-12-27 LAB — LIPID PANEL
Cholesterol: 188 mg/dL (ref 0–200)
HDL: 47.4 mg/dL (ref >39.00)
LDL Cholesterol: 104 mg/dL — ABNORMAL HIGH (ref 0–99)
NonHDL: 140.38
Total CHOL/HDL Ratio: 4
Triglycerides: 180 mg/dL — ABNORMAL HIGH (ref 0.0–149.0)
VLDL: 36 mg/dL (ref 0.0–40.0)

## 2021-12-31 ENCOUNTER — Encounter: Payer: Self-pay | Admitting: Medical

## 2022-01-01 NOTE — Addendum Note (Signed)
Addended by: Gwenevere Abbot on: 01/01/2022 12:08 PM   Modules accepted: Orders

## 2022-01-11 DIAGNOSIS — F4322 Adjustment disorder with anxiety: Secondary | ICD-10-CM | POA: Diagnosis not present

## 2022-01-18 ENCOUNTER — Encounter: Payer: Self-pay | Admitting: Medical

## 2022-01-18 NOTE — Telephone Encounter (Signed)
For review

## 2022-01-23 ENCOUNTER — Telehealth: Payer: Self-pay | Admitting: Medical

## 2022-01-23 NOTE — Telephone Encounter (Signed)
Pt would like to know if he can bring in [employer] with medical records attached for [facility].   Please advise.

## 2022-01-24 ENCOUNTER — Ambulatory Visit: Payer: Self-pay

## 2022-01-24 ENCOUNTER — Encounter: Payer: Self-pay | Admitting: Family Medicine

## 2022-01-24 ENCOUNTER — Ambulatory Visit (INDEPENDENT_AMBULATORY_CARE_PROVIDER_SITE_OTHER): Payer: BC Managed Care – PPO | Admitting: Family Medicine

## 2022-01-24 VITALS — BP 102/90 | Ht 77.0 in | Wt 245.0 lb

## 2022-01-24 DIAGNOSIS — M23301 Other meniscus derangements, unspecified lateral meniscus, left knee: Secondary | ICD-10-CM | POA: Insufficient documentation

## 2022-01-24 DIAGNOSIS — M25562 Pain in left knee: Secondary | ICD-10-CM

## 2022-01-24 MED ORDER — DICLOFENAC SODIUM 2 % EX SOLN: 1.0000 "application " | Freq: Two times a day (BID) | CUTANEOUS | 2 refills | Status: DC

## 2022-01-24 NOTE — Patient Instructions (Signed)
Good to see you Please try ice as needed  Please try the brace Please try the exercises  Please try the rub on medicine   Please send me a message in MyChart with any questions or updates.  Please see me back in 4-6 weeks.   --Dr. Jordan Likes

## 2022-01-24 NOTE — Telephone Encounter (Signed)
Pt called and notified he is able to send those records via [employer] again

## 2022-01-24 NOTE — Progress Notes (Signed)
°  Casey Carson - 51 y.o. male MRN [id number]  Date of birth: 1971/10/06  SUBJECTIVE:  Including CC & ROS.  No chief complaint on file.   Casey Carson is a 51 y.o. male that is presenting with acute left knee pain.  The pain is worse with getting in and out of the car going up and down stairs.  It is occurring on the outside of his knee.  No history of similar pain.    Review of Systems See HPI   HISTORY: Past Medical, Surgical, Social, and Family History Reviewed & Updated per EMR.   Pertinent Historical Findings include:  Past Medical History:  Diagnosis Date   GERD (gastroesophageal reflux disease)     History reviewed. No pertinent surgical history.   PHYSICAL EXAM:  VS: BP 102/90 (BP Location: Left Arm, Patient Position: Sitting)    Ht 6\' 5"  (1.956 m)    Wt 245 lb (111.1 kg)    BMI 29.05 kg/m  Physical Exam Gen: NAD, alert, cooperative with exam, well-appearing MSK:  Neurovascularly intact    Limited ultrasound: Left knee:  Mild effusion suprapatellar pouch. Normal-appearing quadricep and patellar tendon. Normal-appearing medial joint space. 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  Has degenerative changes of the meniscus on the lateral compartment. -Counseled on home exercise therapy and supportive care. -Hinged knee brace. -Pennsaid. -Could consider injection or further imaging.

## 2022-01-24 NOTE — Assessment & Plan Note (Signed)
Acutely occurring.  Has degenerative changes of the meniscus on the lateral compartment. -Counseled on home exercise therapy and supportive care. -Hinged knee brace. -Pennsaid. -Could consider injection or further imaging.

## 2022-02-28 ENCOUNTER — Ambulatory Visit: Payer: BC Managed Care – PPO | Admitting: Family Medicine

## 2022-11-21 IMAGING — DX DG LUMBAR SPINE 2-3V
3 series · 3 of 3 positions shown · non-contrast
Comparison: None.

CLINICAL DATA: lumbar radiculopathy

EXAM:
LUMBAR SPINE - 2-3 VIEW

[l-spine ap]
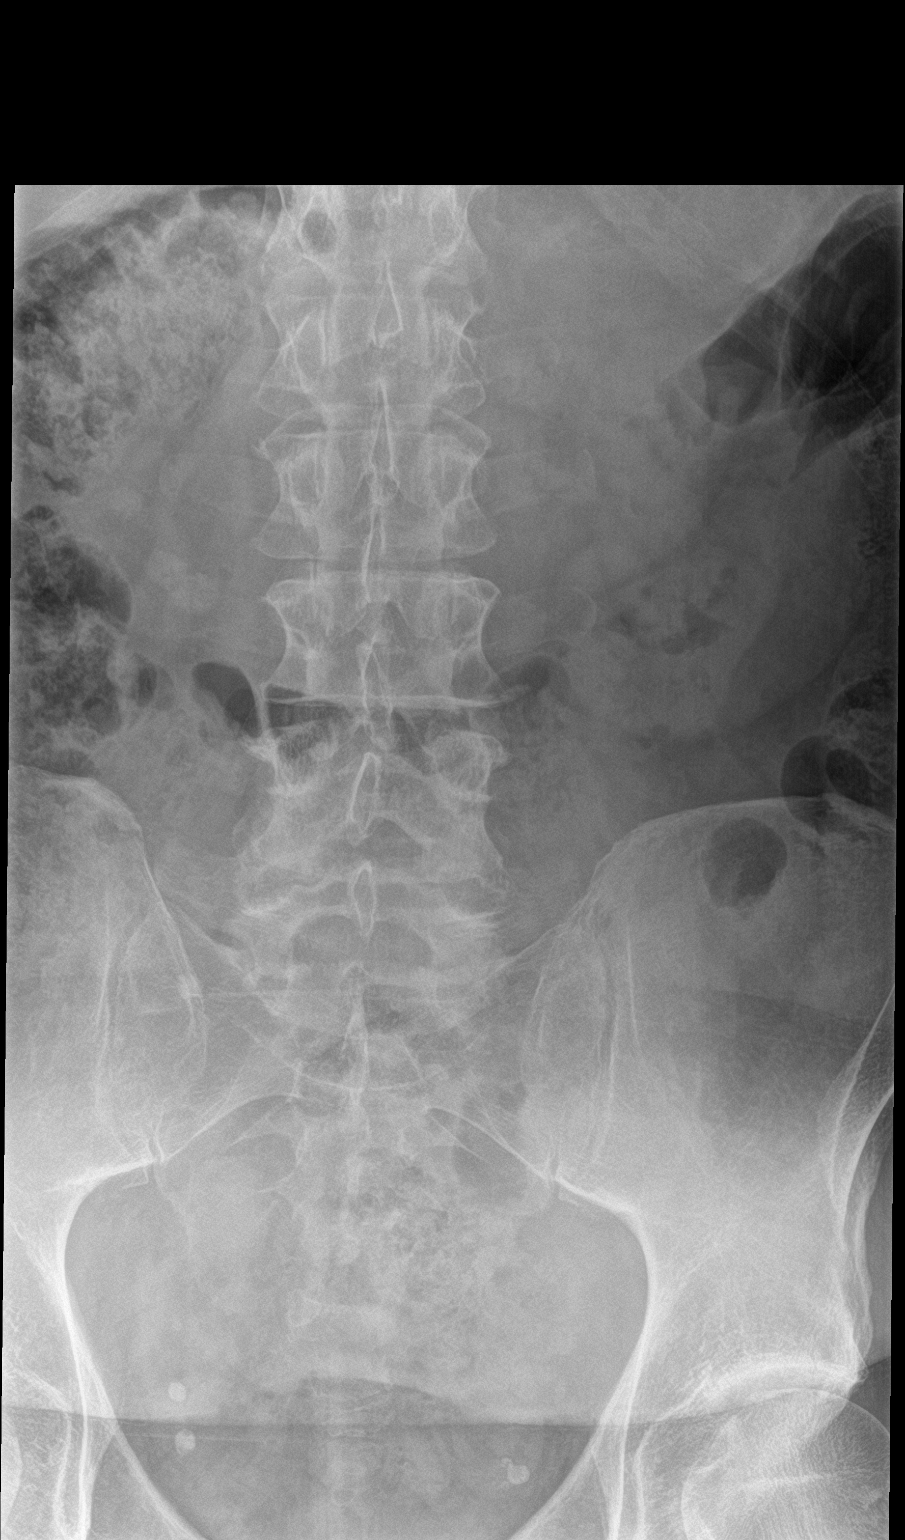

[l-spine lat]
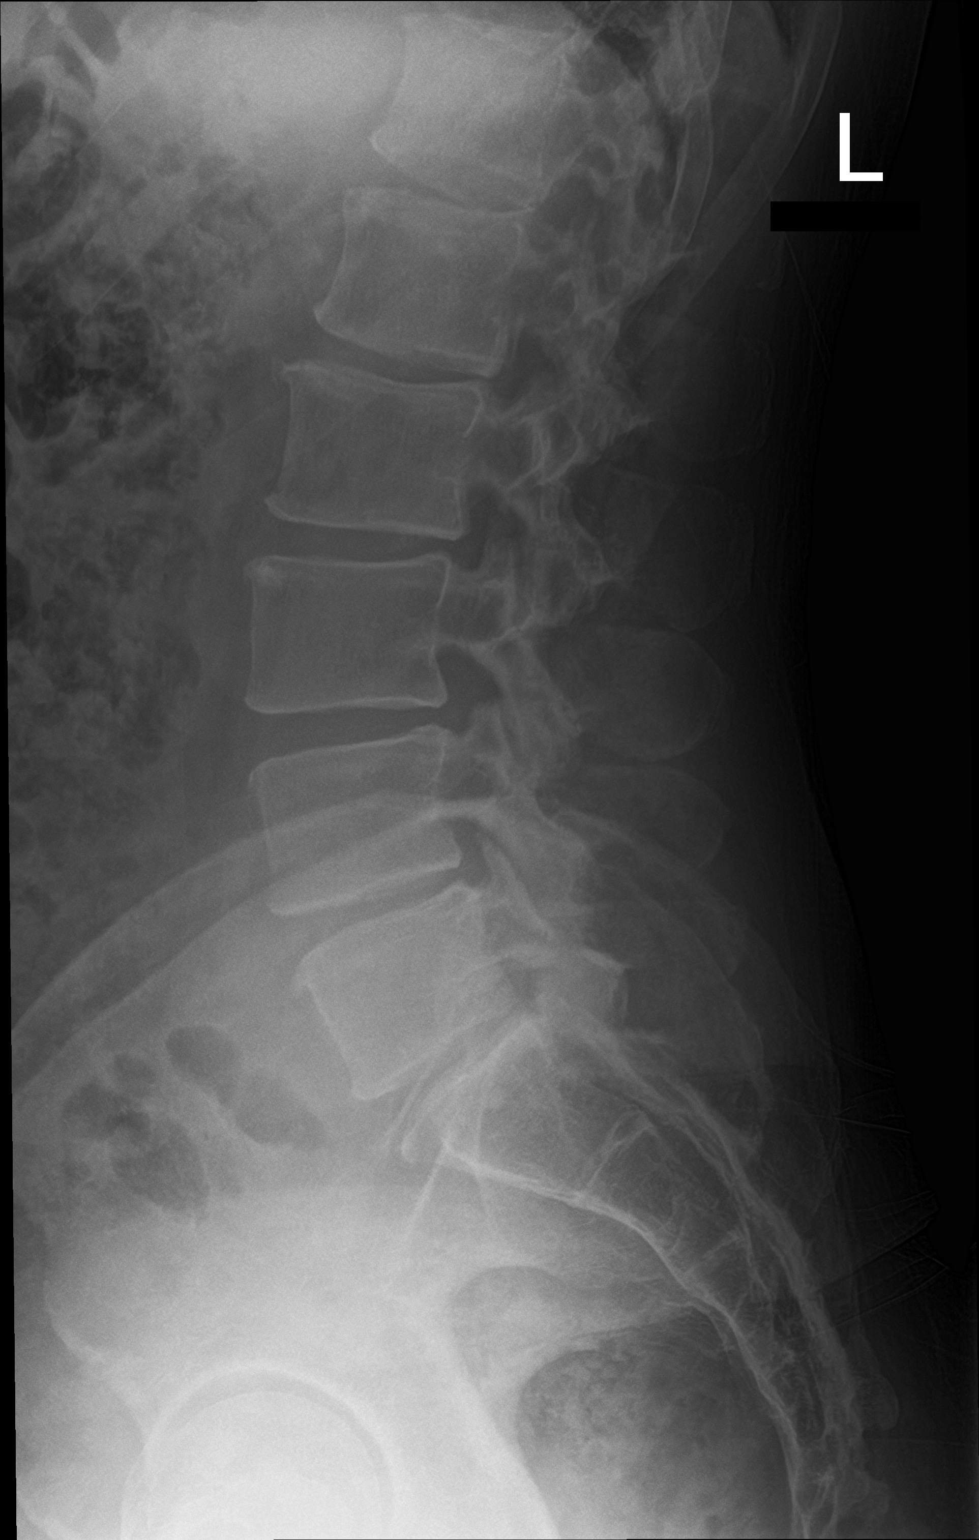

[l-spine spot]
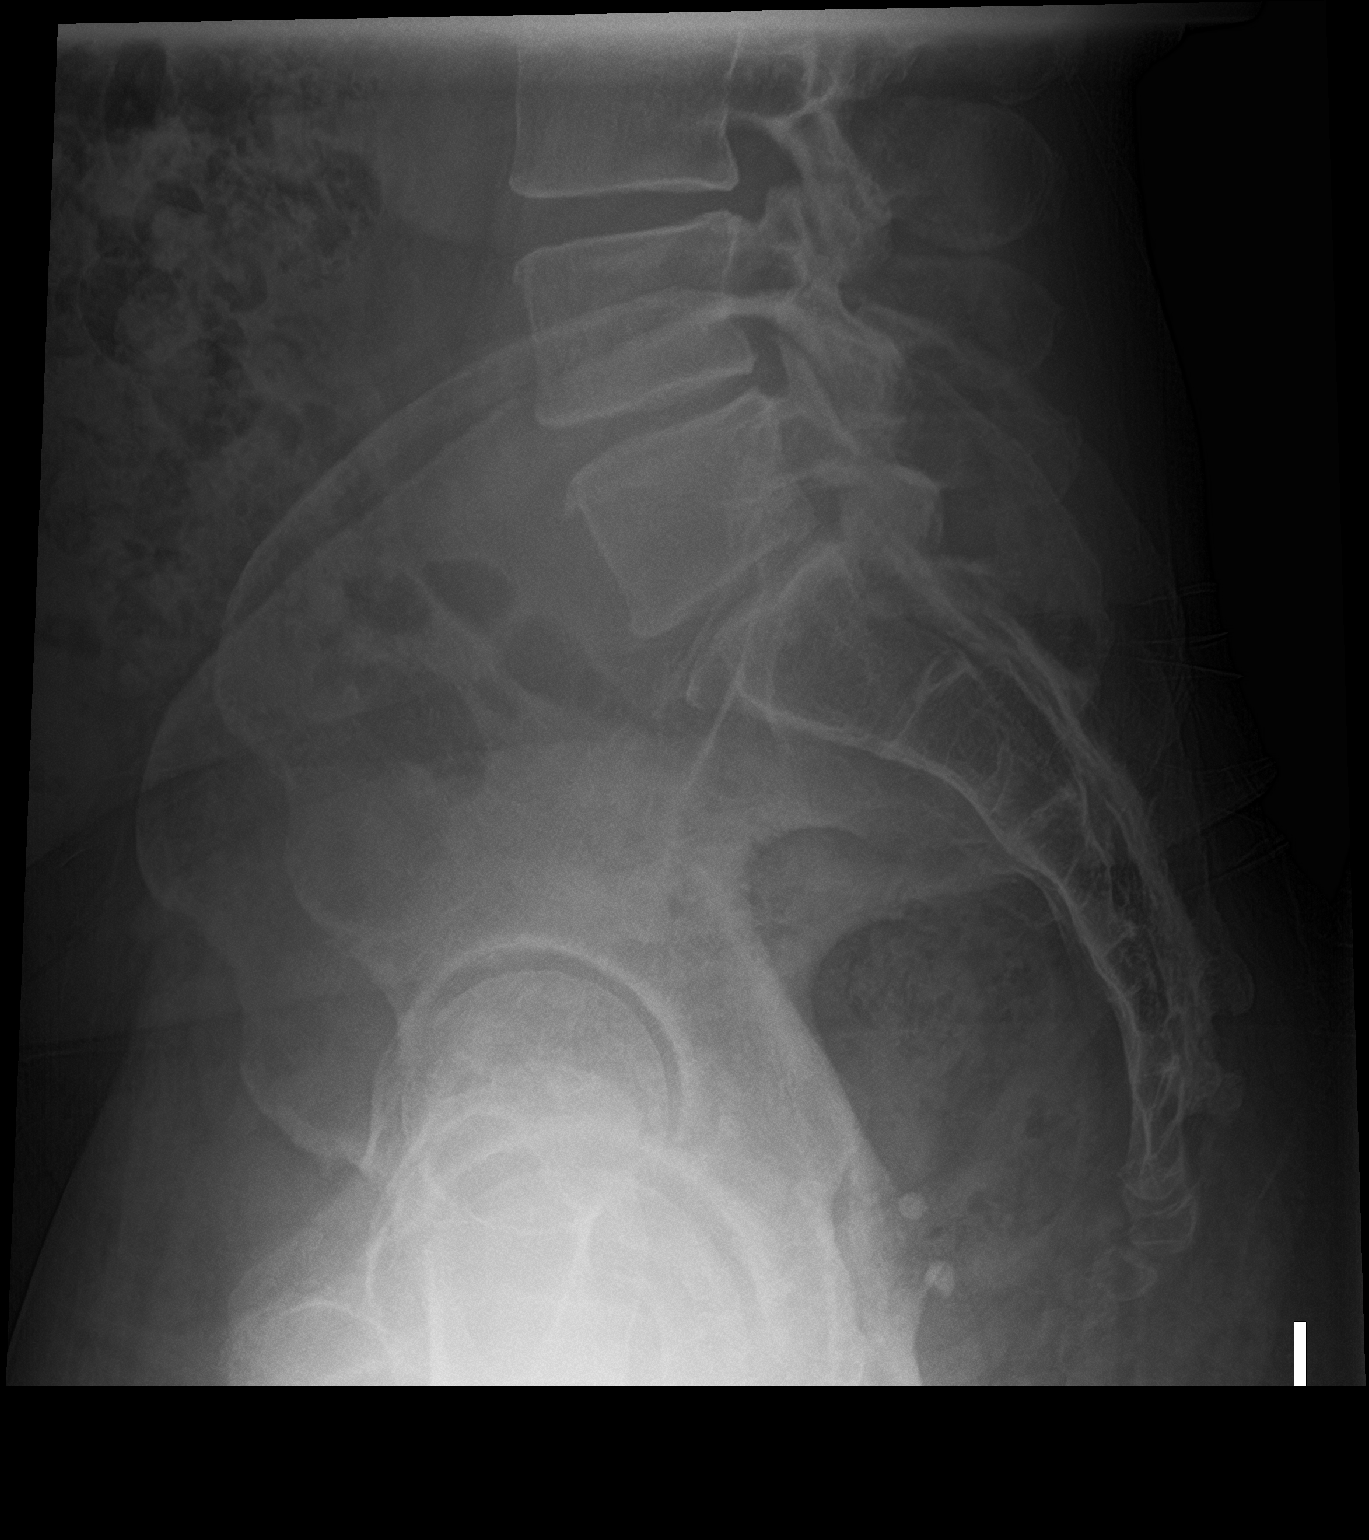

[3 of 3 positions shown; findings below may reference images not displayed]

FINDINGS: Are 5 non-rib-bearing lumbar vertebrae. There is mild-to-moderate
multilevel degenerative disc disease, worst at L4-L5 and L5-S1.
There is mild lower lumbar predominant facet arthropathy. There is
no significant listhesis. There is no evidence of fracture.
IMPRESSION: Mild to moderate multilevel degenerative disc disease, worst at
L4-L5 and L5-S1.

Mild lower lumbar predominant facet arthropathy.

## 2023-01-25 ENCOUNTER — Other Ambulatory Visit: Payer: Self-pay

## 2023-01-25 ENCOUNTER — Emergency Department (HOSPITAL_BASED_OUTPATIENT_CLINIC_OR_DEPARTMENT_OTHER)
Admission: EM | Admit: 2023-01-25 | Discharge: 2023-01-25 | Disposition: A | Discharge status: Home / Self Care | Payer: BC Managed Care – PPO | Attending: Emergency Medicine | Admitting: Emergency Medicine

## 2023-01-25 ENCOUNTER — Emergency Department (HOSPITAL_BASED_OUTPATIENT_CLINIC_OR_DEPARTMENT_OTHER): Payer: BC Managed Care – PPO

## 2023-01-25 ENCOUNTER — Encounter (HOSPITAL_BASED_OUTPATIENT_CLINIC_OR_DEPARTMENT_OTHER): Payer: Self-pay

## 2023-01-25 DIAGNOSIS — R519 Headache, unspecified: Secondary | ICD-10-CM | POA: Diagnosis not present

## 2023-01-25 DIAGNOSIS — R110 Nausea: Secondary | ICD-10-CM | POA: Diagnosis not present

## 2023-01-25 DIAGNOSIS — R9431 Abnormal electrocardiogram [ECG] [EKG]: Secondary | ICD-10-CM | POA: Diagnosis not present

## 2023-01-25 DIAGNOSIS — R109 Unspecified abdominal pain: Secondary | ICD-10-CM | POA: Diagnosis not present

## 2023-01-25 DIAGNOSIS — R1084 Generalized abdominal pain: Secondary | ICD-10-CM | POA: Diagnosis not present

## 2023-01-25 DIAGNOSIS — R112 Nausea with vomiting, unspecified: Secondary | ICD-10-CM | POA: Diagnosis not present

## 2023-01-25 LAB — CBC WITH DIFFERENTIAL/PLATELET
Abs Immature Granulocytes: 0.01 10*3/uL (ref 0.00–0.07)
Basophils Absolute: 0 10*3/uL (ref 0.0–0.1)
Basophils Relative: 1 %
Eosinophils Absolute: 0.1 10*3/uL (ref 0.0–0.5)
Eosinophils Relative: 2 %
HCT: 37.2 % — ABNORMAL LOW (ref 39.0–52.0)
Hemoglobin: 13.1 g/dL (ref 13.0–17.0)
Immature Granulocytes: 0 %
Lymphocytes Relative: 44 %
Lymphs Abs: 2.5 10*3/uL (ref 0.7–4.0)
MCH: 31.8 pg (ref 26.0–34.0)
MCHC: 35.2 g/dL (ref 30.0–36.0)
MCV: 90.3 fL (ref 80.0–100.0)
Monocytes Absolute: 0.7 10*3/uL (ref 0.1–1.0)
Monocytes Relative: 11 %
Neutro Abs: 2.4 10*3/uL (ref 1.7–7.7)
Neutrophils Relative %: 42 %
Platelets: 168 10*3/uL (ref 150–400)
RBC: 4.12 MIL/uL — ABNORMAL LOW (ref 4.22–5.81)
RDW: 12.5 % (ref 11.5–15.5)
WBC: 5.7 10*3/uL (ref 4.0–10.5)
nRBC: 0 % (ref 0.0–0.2)

## 2023-01-25 LAB — COMPREHENSIVE METABOLIC PANEL
ALT: 16 U/L (ref 0–44)
AST: 27 U/L (ref 15–41)
Albumin: 3.9 g/dL (ref 3.5–5.0)
Alkaline Phosphatase: 50 U/L (ref 38–126)
Anion gap: 8 (ref 5–15)
BUN: 12 mg/dL (ref 6–20)
CO2: 27 mmol/L (ref 22–32)
Calcium: 8.6 mg/dL — ABNORMAL LOW (ref 8.9–10.3)
Chloride: 101 mmol/L (ref 98–111)
Creatinine, Ser: 0.74 mg/dL (ref 0.61–1.24)
GFR, Estimated: >60 mL/min (ref >60)
Glucose, Bld: 94 mg/dL (ref 70–99)
Potassium: 3.3 mmol/L — ABNORMAL LOW (ref 3.5–5.1)
Sodium: 136 mmol/L (ref 135–145)
Total Bilirubin: 0.8 mg/dL (ref 0.3–1.2)
Total Protein: 6.8 g/dL (ref 6.5–8.1)

## 2023-01-25 LAB — LIPASE, BLOOD: Lipase: 33 U/L (ref 11–51)

## 2023-01-25 MED ORDER — IOHEXOL 300 MG/ML  SOLN
100.0000 mL | Freq: Once | INTRAMUSCULAR | Status: AC | PRN
  Administered 2023-01-25: 100 mL via INTRAVENOUS

## 2023-01-25 MED ORDER — ONDANSETRON 8 MG PO TBDP: ORAL_TABLET | ORAL | 0 refills | Status: DC

## 2023-01-25 MED ORDER — KETOROLAC TROMETHAMINE 30 MG/ML IJ SOLN
30.0000 mg | Freq: Once | INTRAMUSCULAR | Status: AC
  Administered 2023-01-25: 30 mg via INTRAVENOUS
  Filled 2023-01-25: qty 1

## 2023-01-25 MED ORDER — SODIUM CHLORIDE 0.9 % IV BOLUS
1000.0000 mL | Freq: Once | INTRAVENOUS | Status: AC
  Administered 2023-01-25: 1000 mL via INTRAVENOUS

## 2023-01-25 MED ORDER — ONDANSETRON HCL 4 MG/2ML IJ SOLN
4.0000 mg | Freq: Once | INTRAMUSCULAR | Status: AC
  Administered 2023-01-25: 4 mg via INTRAVENOUS
  Filled 2023-01-25: qty 2

## 2023-01-25 NOTE — ED Provider Notes (Signed)
[facility] Provider Note   CSN: [id number] Arrival date & time: 01/25/23  0207     History  Chief Complaint  Patient presents with   Headache   Nausea    Casey Carson is a 52 y.o. male.  Patient is a 52 year old male with history of low back pain.  Patient presenting today with complaints of nausea, abdominal pain, and headache.  This started acutely this evening while he was preparing a snack for him and his daughter.  He describes feeling "queasy", then took some Pepto-Bismol.  He then describes experiencing "spasms" throughout his abdomen along with a feeling of severe nausea.  He thought he was going to throw up, then went to the bathroom, but was unable.  He attempted to force himself to vomit by putting his fingers down his throat and describes having "dry heaves".  He then felt a pressure in his head like "all of the blood in his body was pushed to his head".  He reports he has never experienced a feeling like this before and is very concerned.  He denies to me he is having any abdominal pain at present, just spasms.  He denies any fevers or chills.  He denies any ill contacts.  The history is provided by the patient.       Home Medications Prior to Admission medications   Medication Sig Start Date End Date Taking? Authorizing Provider  COVID-19 mRNA bivalent vaccine, Pfizer, (PFIZER COVID-19 VAC BIVALENT) injection Inject into the muscle. 12/17/21   Carlyle Basques, MD  Diclofenac Sodium (PENNSAID) 2 % SOLN Place 1 application onto the skin 2 (two) times daily. 01/24/22   Rosemarie Ax, MD  Glucosamine-Chondroit-Vit C-Mn (GLUCOSAMINE 1500 COMPLEX PO) Take by mouth.    [provider]  Lactobacillus-Inulin (CULTURELLE ADULT ULT BALANCE PO) Take by mouth.    [provider]  magnesium gluconate (MAGONATE) 500 MG tablet Take 500 mg by mouth 2 (two) times daily.    [provider]  Multiple  Vitamin (MULTIVITAMIN) tablet Take 1 tablet by mouth daily.    [provider]  Omega-3 Fatty Acids (OMEGA 3 500) 500 MG CAPS Take by mouth.    [provider]  Turmeric 500 MG TABS Take by mouth.    [provider]      Allergies    Patient has no known allergies.    Review of Systems   Review of Systems  All other systems reviewed and are negative.   Physical Exam Updated Vital Signs BP (!) 144/104   Pulse 71   Temp 97.9 F (36.6 C) (Oral)   Resp 18   Ht 6' 4"$  (1.93 m)   Wt 107 kg   SpO2 97%   BMI 28.73 kg/m  Physical Exam Vitals and nursing note reviewed.  Constitutional:      General: He is not in acute distress.    Appearance: He is well-developed. He is not diaphoretic.  HENT:     Head: Normocephalic and atraumatic.  Cardiovascular:     Rate and Rhythm: Normal rate and regular rhythm.     Heart sounds: No murmur heard.    No friction rub.  Pulmonary:     Effort: Pulmonary effort is normal. No respiratory distress.     Breath sounds: Normal breath sounds. No wheezing or rales.  Abdominal:     General: Bowel sounds are normal. There is no distension.     Palpations: Abdomen  is soft.     Tenderness: There is no abdominal tenderness.  Musculoskeletal:        General: Normal range of motion.     Cervical back: Normal range of motion and neck supple.  Skin:    General: Skin is warm and dry.  Neurological:     Mental Status: He is alert and oriented to person, place, and time.     Coordination: Coordination normal.     ED Results / Procedures / Treatments   Labs (all labs ordered are listed, but only abnormal results are displayed) Labs Reviewed - No data to display  EKG None  Radiology No results found.  Procedures Procedures    Medications Ordered in ED Medications  sodium chloride 0.9 % bolus 1,000 mL (has no administration in time range)  ondansetron (ZOFRAN) injection 4 mg (has no administration in time range)   ketorolac (TORADOL) 30 MG/ML injection 30 mg (has no administration in time range)    ED Course/ Medical Decision Making/ A&P  Patient is a 52 year old male presenting with complaints of nausea, abdominal spasms, and headache.  This started acutely this evening prior to presentation.  He arrives here with stable vital signs and is afebrile.  Physical examination reveals mild generalized abdominal tenderness, but is otherwise unremarkable.  Workup initiated including CBC, CMP, and lipase.  These were all unremarkable.  CT scan of the head and abdomen obtained due to his reported headache and abdominal pain.  The studies were also unremarkable.  Patient was given IV fluids along with Toradol and Zofran and seems to be feeling better.  At this point, I feel as though the symptoms are likely related to a viral gastroenteritis or foodborne illness.  I feel as though he can safely be discharged with Zofran and as needed return.  I doubt other emergent cause.  Final Clinical Impression(s) / ED Diagnoses Final diagnoses:  None    Rx / DC Orders ED Discharge Orders     None         Veryl Speak, MD 01/25/23 [phone]

## 2023-01-25 NOTE — Discharge Instructions (Signed)
Begin taking Zofran as prescribed as needed for nausea.  Follow-up with primary doctor if symptoms or not improving in the next few days, and return to the ER if symptoms significantly worsen or change.

## 2023-01-25 NOTE — ED Triage Notes (Signed)
Pt reports having a late snack and felt a little "queasy". Took fiber and Pepto bismol. Woke up feeling nauseous and felt a pressure in his head when he was dry heaving. Pt feels like he needs to pass a BM, but cannot go. Has not been around anyone sick. Pt thinks he is gassy and has discomfort in abdomen.

## 2023-01-27 ENCOUNTER — Telehealth: Payer: Self-pay

## 2023-01-27 NOTE — Transitions of Care (Post Inpatient/ED Visit) (Signed)
   01/27/2023  Name: Yiming Leblanc MRN: [id number] DOB: 1971-03-17  Today's TOC FU Call Status: Today's TOC FU Call Status:: Successful TOC FU Call Competed TOC FU Call Complete Date: 01/27/23  Transition Care Management Follow-up Telephone Call Date of Discharge: 01/25/23 Discharge Facility: [facility] Type of Discharge: Emergency Department Reason for ED Visit: Other: (GI) How have you been since you were released from the hospital?: Better Any questions or concerns?: No  Items Reviewed: Did you receive and understand the discharge instructions provided?: Yes Medications obtained and verified?: Yes (Medications Reviewed) Any new allergies since your discharge?: No Dietary orders reviewed?: No Do you have support at home?: Yes  Home Care and Equipment/Supplies: [place] Ordered?: No Any new equipment or medical supplies ordered?: No  Functional Questionnaire: Do you need assistance with bathing/showering or dressing?: No Do you need assistance with meal preparation?: No Do you need assistance with eating?: No Do you have difficulty maintaining continence: No Do you need assistance with getting out of bed/getting out of a chair/moving?: No Do you have difficulty managing or taking your medications?: No  Folllow up appointments reviewed: PCP Follow-up appointment confirmed?: No (Declined) MD Provider Line Number:[phone] Given: Yes [facility] Follow-up appointment confirmed?: NA Do you need transportation to your follow-up appointment?: No Do you understand care options if your condition(s) worsen?: Yes-patient verbalized understanding    [place]

## 2023-03-24 ENCOUNTER — Encounter: Payer: Self-pay | Admitting: *Deleted

## 2023-05-30 ENCOUNTER — Ambulatory Visit (INDEPENDENT_AMBULATORY_CARE_PROVIDER_SITE_OTHER): Payer: BC Managed Care – PPO | Admitting: Medical

## 2023-05-30 VITALS — BP 114/74 | HR 80 | Temp 98.0°F | Resp 18 | Ht 76.0 in | Wt 239.0 lb

## 2023-05-30 DIAGNOSIS — E876 Hypokalemia: Secondary | ICD-10-CM

## 2023-05-30 DIAGNOSIS — R300 Dysuria: Secondary | ICD-10-CM | POA: Diagnosis not present

## 2023-05-30 DIAGNOSIS — Z862 Personal history of diseases of the blood and blood-forming organs and certain disorders involving the immune mechanism: Secondary | ICD-10-CM

## 2023-05-30 DIAGNOSIS — R739 Hyperglycemia, unspecified: Secondary | ICD-10-CM

## 2023-05-30 DIAGNOSIS — L989 Disorder of the skin and subcutaneous tissue, unspecified: Secondary | ICD-10-CM | POA: Diagnosis not present

## 2023-05-30 DIAGNOSIS — R350 Frequency of micturition: Secondary | ICD-10-CM | POA: Diagnosis not present

## 2023-05-30 LAB — POCT URINALYSIS DIPSTICK
Bilirubin, UA: NEGATIVE
Blood, UA: NEGATIVE
Glucose, UA: NEGATIVE
Ketones, UA: NEGATIVE
Leukocytes, UA: NEGATIVE
Nitrite, UA: NEGATIVE
Protein, UA: NEGATIVE
Spec Grav, UA: 1.01 (ref 1.010–1.025)
Urobilinogen, UA: 0.2 E.U./dL
pH, UA: 5 (ref 5.0–8.0)

## 2023-05-30 NOTE — Progress Notes (Signed)
Subjective:    Patient ID: Casey Carson, male    DOB: 06-12-1971, 52 y.o.   MRN: [id number]  HPI  Pt in for recent urinary symptoms some frequent urination. Also states  reddish brown stain on underwear that he thinks is  from urethra. He saw stain on his underwear.  Never saw any obvious dc from his penis. No report of testicle  pain or rash on penis.  He states if feels like has urge to urinate more than normal. He is urinating about 3-4 times at night for a couple of months.   Pt thinks most of time he can start flow of urine. Today slight burning sensation at times.  No fever, no chills or sweats.  Pt shows me picture  on his phone that  looks like red colored stain on underwear.  Pt states the staining on his urine has been happening for more than a month. Occurring intermittently.  Pt also has premature ejaculation. He request referral to urologist.Pt is married.    Review of Systems  Constitutional:  Negative for chills and fatigue.  Respiratory:  Negative for cough, choking and wheezing.   Cardiovascular:  Negative for chest pain and palpitations.  Gastrointestinal:  Negative for abdominal pain.  Genitourinary:  Positive for frequency. Negative for difficulty urinating, genital sores, penile pain and penile swelling.       Blood appearing stain on his underwear.  Premature ejaculation.  Musculoskeletal:  Negative for back pain.  Skin:        Left forearm rash. Noticed just recently and pt is concerned about this area.  Neurological:  Negative for dizziness, light-headedness and headaches.  Hematological:  Negative for adenopathy. Does not bruise/bleed easily.   Past Medical History:  Diagnosis Date   GERD (gastroesophageal reflux disease)      Social History   Socioeconomic History   Marital status: Married    Spouse name: Not on file   Number of children: Not on file   Years of education: Not on file   Highest education level: Not on file   Occupational History   Not on file  Tobacco Use   Smoking status: Never   Smokeless tobacco: Never  Vaping Use   Vaping Use: Never used  Substance and Sexual Activity   Alcohol use: Yes    Alcohol/week: 2.0 standard drinks of alcohol    Types: 2 Cans of beer per week    Comment: on average a week.   Drug use: Never   Sexual activity: Yes  Other Topics Concern   Not on file  Social History Narrative   Not on file   Social Determinants of Health   Financial Resource Strain: Not on file  Food Insecurity: Not on file  Transportation Needs: Not on file  Physical Activity: Not on file  Stress: Not on file  Social Connections: Not on file  Intimate Partner Violence: Not on file    No past surgical history on file.  No family history on file.  No Known Allergies  Current Outpatient Medications on File Prior to Visit  Medication Sig Dispense Refill   COVID-19 mRNA bivalent vaccine, Pfizer, (PFIZER COVID-19 VAC BIVALENT) injection Inject into the muscle. 0.3 mL 0   Diclofenac Sodium (PENNSAID) 2 % SOLN Place 1 application onto the skin 2 (two) times daily. 112 g 2   Glucosamine-Chondroit-Vit C-Mn (GLUCOSAMINE 1500 COMPLEX PO) Take by mouth.     Lactobacillus-Inulin (CULTURELLE ADULT ULT BALANCE PO) Take by mouth.  magnesium gluconate (MAGONATE) 500 MG tablet Take 500 mg by mouth 2 (two) times daily.     Multiple Vitamin (MULTIVITAMIN) tablet Take 1 tablet by mouth daily.     Omega-3 Fatty Acids (OMEGA 3 500) 500 MG CAPS Take by mouth.     ondansetron (ZOFRAN-ODT) 8 MG disintegrating tablet 8mg  ODT q4 hours prn nausea 10 tablet 0   Turmeric 500 MG TABS Take by mouth.     No current facility-administered medications on file prior to visit.    BP 114/74   Pulse 80   Temp 98 F (36.7 C)   Resp 18   Ht 6\' 4"  (1.93 m)   Wt 239 lb (108.4 kg)   SpO2 98%   BMI 29.09 kg/m         Objective:   Physical Exam  General Mental Status- Alert. General Appearance- Not in  acute distress.    Chest and Lung Exam Auscultation: Breath Sounds:-Normal.  Cardiovascular Auscultation:Rythm- Regular. Murmurs & Other Heart Sounds:Auscultation of the heart reveals- No Murmurs.  Abdomen Inspection:-Inspeection Normal. Palpation/Percussion:Note:No mass. Palpation and Percussion of the abdomen reveal- Non Tender, Non Distended + BS, no rebound or guarding.   Neurologic Cranial Nerve exam:- CN III-XII intact(No nystagmus), symmetric smile. Strength:- 5/5 equal and symmetric strength both upper and lower extremities.   Back- no cva tenderness.  Derm- left forearm mid aspect faint 1 cm wide rash. Slight hyperpigmented rash. No raised features. Uniform in color,     Assessment & Plan:   Patient Instructions  Dysuria sporadic/rare with some frequent urination. Follow lab and may rx antibiotic if psa incrased or culture +. - POCT Urinalysis Dipstick - Urine Culture - PSA   By appearance of blood on your underwear with above symptoms will place referral to urologist. Also evaluate your premature ejacuation.     Skin lesion -left forearm faint skin lesion. Refer to derm  Hypokalemia Comp Met (CMET)  Elevated blood sugar - Hemoglobin A1c   Follow up wellness exam September or October   Esperanza Richters, PA-C

## 2023-05-30 NOTE — Patient Instructions (Addendum)
Dysuria sporadic/rare with some frequent urination. Follow lab and may rx antibiotic if psa increased or culture +. - POCT Urinalysis Dipstick - Urine Culture - PSA   By appearance of blood on your underwear with above symptoms will place referral to urologist. Also evaluate your premature ejacuation.     Skin lesion -left forearm faint skin lesion. Refer to derm  Hypokalemia Comp Met (CMET)  Elevated blood sugar - Hemoglobin A1c   Follow up wellness exam September or October

## 2023-05-31 LAB — COMPREHENSIVE METABOLIC PANEL
AG Ratio: 1.7 (calc) (ref 1.0–2.5)
ALT: 13 U/L (ref 9–46)
AST: 20 U/L (ref 10–35)
Albumin: 4.3 g/dL (ref 3.6–5.1)
Alkaline phosphatase (APISO): 58 U/L (ref 35–144)
BUN: 17 mg/dL (ref 7–25)
CO2: 28 mmol/L (ref 20–32)
Calcium: 9.4 mg/dL (ref 8.6–10.3)
Chloride: 102 mmol/L (ref 98–110)
Creat: 0.91 mg/dL (ref 0.70–1.30)
Globulin: 2.5 g/dL (calc) (ref 1.9–3.7)
Glucose, Bld: 115 mg/dL — ABNORMAL HIGH (ref 65–99)
Potassium: 3.7 mmol/L (ref 3.5–5.3)
Sodium: 141 mmol/L (ref 135–146)
Total Bilirubin: 0.6 mg/dL (ref 0.2–1.2)
Total Protein: 6.8 g/dL (ref 6.1–8.1)

## 2023-05-31 LAB — HEMOGLOBIN A1C
Hgb A1c MFr Bld: 5.4 % of total Hgb (ref <5.7)
Mean Plasma Glucose: 108 mg/dL
eAG (mmol/L): 6 mmol/L

## 2023-05-31 LAB — PSA: PSA: 1.45 ng/mL (ref <=4.00)

## 2023-05-31 LAB — URINE CULTURE
MICRO NUMBER:: 15112550
Result:: NO GROWTH
SPECIMEN QUALITY:: ADEQUATE

## 2023-06-11 ENCOUNTER — Ambulatory Visit (INDEPENDENT_AMBULATORY_CARE_PROVIDER_SITE_OTHER): Payer: BC Managed Care – PPO | Admitting: Urology

## 2023-06-11 ENCOUNTER — Encounter: Payer: Self-pay | Admitting: Urology

## 2023-06-11 VITALS — BP 149/77 | HR 75 | Ht 77.0 in | Wt 236.0 lb

## 2023-06-11 DIAGNOSIS — Z8042 Family history of malignant neoplasm of prostate: Secondary | ICD-10-CM | POA: Diagnosis not present

## 2023-06-11 DIAGNOSIS — R399 Unspecified symptoms and signs involving the genitourinary system: Secondary | ICD-10-CM | POA: Diagnosis not present

## 2023-06-11 DIAGNOSIS — F524 Premature ejaculation: Secondary | ICD-10-CM

## 2023-06-11 LAB — URINALYSIS, ROUTINE W REFLEX MICROSCOPIC
Bilirubin, UA: NEGATIVE
Glucose, UA: NEGATIVE
Ketones, UA: NEGATIVE
Leukocytes,UA: NEGATIVE
Nitrite, UA: NEGATIVE
Protein,UA: NEGATIVE
RBC, UA: NEGATIVE
Specific Gravity, UA: 1.025 (ref 1.005–1.030)
Urobilinogen, Ur: 0.2 mg/dL (ref 0.2–1.0)
pH, UA: 6 (ref 5.0–7.5)

## 2023-06-11 LAB — BLADDER SCAN AMB NON-IMAGING

## 2023-06-11 NOTE — Progress Notes (Signed)
Assessment: 1. Lower urinary tract symptoms (LUTS)   2. Family history of prostate cancer   3. Premature ejaculation      Plan: Today I had a long discussion with the patient regarding his urologic complaints. Will continue watchful waiting for his mild lower urinary tract symptoms. In terms of his family history of prostate cancer I have recommended he have yearly PSA testing with his PCP Finally, we discussed premature ejaculation in great detail today including options for further treatment.  We discussed off label use of SSRIs including the results as well as potential adverse events and side effects.  He reports that he is taking [place]. John's wort and does not want to try this at present.  We did discuss some sex therapy techniques and also offered him referral for sex therapy.  He will let us know.  Follow-up as needed  Chief Complaint: urinary complaints  History of Present Illness:  Casey Carson is a 52 y.o. male who is seen in consultation from Saguier, Ramon Dredge, [place] for evaluation of a number of urologic issues. Patient complained to pcp LUTS, penile discharge on underwear and PE. IPSS today= 5. PVR= 12 Urine dip and culture 05/2023 neg. UA today entirely clear. Patient denies any hematuria or hematospermia. He does report that he tends to ejaculate a lot sooner than he used to several years ago.  He states that he now ejaculates usually approximately 3 to 4 minutes after penetration.  PSA 05/2023= 1.45 Renal function nl.  Patient does have a family history of prostate cancer and a uncle.  Past Medical History:  Past Medical History:  Diagnosis Date   GERD (gastroesophageal reflux disease)     Past Surgical History:  No past surgical history on file.  Allergies:  No Known Allergies  Family History:  No family history on file.  Social History:  Social History   Tobacco Use   Smoking status: Never   Smokeless tobacco: Never  Vaping Use   Vaping  Use: Never used  Substance Use Topics   Alcohol use: Yes    Alcohol/week: 2.0 standard drinks of alcohol    Types: 2 Cans of beer per week    Comment: on average a week.   Drug use: Never    Review of symptoms:  Constitutional:  Negative for unexplained weight loss, night sweats, fever, chills ENT:  Negative for nose bleeds, sinus pain, painful swallowing CV:  Negative for chest pain, shortness of breath, exercise intolerance, palpitations, loss of consciousness Resp:  Negative for cough, wheezing, shortness of breath GI:  Negative for nausea, vomiting, diarrhea, bloody stools GU:  Positives noted in HPI; otherwise negative for gross hematuria, dysuria, urinary incontinence Neuro:  Negative for seizures, poor balance, limb weakness, slurred speech Psych:  Negative for lack of energy, depression, anxiety Endocrine:  Negative for polydipsia, polyuria, symptoms of hypoglycemia (dizziness, hunger, sweating) Hematologic:  Negative for anemia, purpura, petechia, prolonged or excessive bleeding, use of anticoagulants  Allergic:  Negative for difficulty breathing or choking as a result of exposure to anything; no shellfish allergy; no allergic response (rash/itch) to materials, foods  Physical exam: BP (!) 149/77   Pulse 75   Ht 6\' 5"  (1.956 m)   Wt 236 lb (107 kg)   BMI 27.99 kg/m  GENERAL APPEARANCE:  Well appearing, well developed, well nourished, NAD   Results: Results for orders placed or performed in visit on 06/11/23 (from the past 24 hour(s))  BLADDER SCAN AMB NON-IMAGING  Collection Time: 06/11/23 12:00 AM  Result Value Ref Range   Scan Result 12ml

## 2023-06-27 ENCOUNTER — Encounter: Payer: BC Managed Care – PPO | Admitting: Medical

## 2024-05-13 ENCOUNTER — Ambulatory Visit (INDEPENDENT_AMBULATORY_CARE_PROVIDER_SITE_OTHER): Admitting: Medical

## 2024-05-13 ENCOUNTER — Ambulatory Visit: Attending: Medical

## 2024-05-13 ENCOUNTER — Encounter: Payer: Self-pay | Admitting: Medical

## 2024-05-13 VITALS — BP 128/70 | HR 63 | Temp 98.8°F | Resp 16 | Ht 77.0 in | Wt 248.0 lb

## 2024-05-13 DIAGNOSIS — R5383 Other fatigue: Secondary | ICD-10-CM

## 2024-05-13 DIAGNOSIS — R0683 Snoring: Secondary | ICD-10-CM

## 2024-05-13 DIAGNOSIS — E538 Deficiency of other specified B group vitamins: Secondary | ICD-10-CM

## 2024-05-13 DIAGNOSIS — R002 Palpitations: Secondary | ICD-10-CM

## 2024-05-13 DIAGNOSIS — G5603 Carpal tunnel syndrome, bilateral upper limbs: Secondary | ICD-10-CM

## 2024-05-13 DIAGNOSIS — N529 Male erectile dysfunction, unspecified: Secondary | ICD-10-CM

## 2024-05-13 DIAGNOSIS — R7989 Other specified abnormal findings of blood chemistry: Secondary | ICD-10-CM

## 2024-05-13 DIAGNOSIS — Z125 Encounter for screening for malignant neoplasm of prostate: Secondary | ICD-10-CM

## 2024-05-13 DIAGNOSIS — R0981 Nasal congestion: Secondary | ICD-10-CM

## 2024-05-13 MED ORDER — FLUTICASONE PROPIONATE 50 MCG/ACT NA SUSP: 2.0000 | Freq: Every day | NASAL | 0 refills | Status: AC

## 2024-05-13 NOTE — Progress Notes (Unsigned)
 EP to read.

## 2024-05-13 NOTE — Progress Notes (Signed)
 Subjective:    Patient ID: Casey Carson, male    DOB: 03-Aug-1971, 53 y.o.   MRN: [id number]  HPI Discussed the use of AI scribe software for clinical note transcription with the patient, who gave verbal consent to proceed.  History of Present Illness   Casey Carson is a 53 year old male who presents with fatigue, snoring, and  other concerns.  He has been experiencing fatigue that has worsened over the last two weeks, despite maintaining his usual sleep schedule. He describes himself as a morning person but feels tired throughout the day. He suspects a lower quality of sleep may be contributing to his symptoms.  His wife has noted increased snoring, and he has gained weight over the years, which he believes may contribute to his symptoms. He wakes up feeling tired and is concerned about potential sleep apnea.  He has a history of occasional palpitations, described as brief one second palpations randomly about once or twice a week.  These episodes have been ongoing for years but have become more frequent recently. He recalls an EKG done in the past and was told it was an electrical problem, possibly PVCs. No chest pain or sob reported.  He experiences numbness in his hands at night, waking up with numbness in both hands, which resolves after shaking them. He works at a [employer] and has been doing some painting, which he believes may contribute to the symptoms. No neck pain is reported, and the numbness has been present for about two weeks. Symptoms only at night and brief.  He has chronic nasal congestion, which has been ongoing for a couple of years, with worsening over the last two months. He uses a saline spray at night and occasionally takes Sudafed in the morning. No significant mucus production or sneezing is reported. He admits in some years nasal congestion worse during spring.  He drinks about six cups of coffee a day, which he acknowledges may contribute to his  symptoms. He has a history of elevated blood pressure, with a reading of 149/77 at a urologist's office a year ago.             Review of Systems  Constitutional:  Negative for chills, fatigue and fever.  Respiratory:  Negative for cough, chest tightness and wheezing.   Cardiovascular:  Negative for chest pain and palpitations.  Gastrointestinal:  Negative for abdominal pain and nausea.  Genitourinary:  Negative for dysuria and frequency.  Musculoskeletal:  Negative for back pain and myalgias.  Neurological:  Negative for dizziness and light-headedness.  Hematological:  Negative for adenopathy. Does not bruise/bleed easily.  Psychiatric/Behavioral:  Negative for behavioral problems, decreased concentration, dysphoric mood and suicidal ideas. The patient is not nervous/anxious.      Past Medical History:  Diagnosis Date   GERD (gastroesophageal reflux disease)      Social History   Socioeconomic History   Marital status: Married    Spouse name: Not on file   Number of children: Not on file   Years of education: Not on file   Highest education level: Not on file  Occupational History   Not on file  Tobacco Use   Smoking status: Never   Smokeless tobacco: Never  Vaping Use   Vaping status: Never Used  Substance and Sexual Activity   Alcohol use: Yes    Alcohol/week: 2.0 standard drinks of alcohol    Types: 2 Cans of beer per week    Comment: on average  a week.   Drug use: Never   Sexual activity: Yes  Other Topics Concern   Not on file  Social History Narrative   Not on file   Social Drivers of Health   Financial Resource Strain: Not on file  Food Insecurity: Not on file  Transportation Needs: Not on file  Physical Activity: Not on file  Stress: Not on file  Social Connections: Not on file  Intimate Partner Violence: Not on file    No past surgical history on file.  No family history on file.  No Known Allergies  Current Outpatient Medications on  File Prior to Visit  Medication Sig Dispense Refill   Multiple Vitamin (MULTIVITAMIN) tablet Take 1 tablet by mouth daily.     COVID-19 mRNA bivalent vaccine, Pfizer, (PFIZER COVID-19 VAC BIVALENT) injection Inject into the muscle. 0.3 mL 0   Glucosamine-Chondroit-Vit C-Mn (GLUCOSAMINE 1500 COMPLEX PO) Take by mouth.     Lactobacillus-Inulin (CULTURELLE ADULT ULT BALANCE PO) Take by mouth.     magnesium gluconate (MAGONATE) 500 MG tablet Take 500 mg by mouth 2 (two) times daily.     No current facility-administered medications on file prior to visit.    BP 128/70   Pulse 63   Temp 98.8 F (37.1 C) (Oral)   Resp 16   Ht 6\' 5"  (1.956 m)   Wt 248 lb (112.5 kg)   SpO2 100%   BMI 29.41 kg/m        Objective:   Physical Exam  General Mental Status- Alert. General Appearance- Not in acute distress.   Skin General: Color- Normal Color. Moisture- Normal Moisture.  Neck Carotid Arteries- Normal color. Moisture- Normal Moisture. No carotid bruits. No JVD.  Chest and Lung Exam Auscultation: Breath Sounds:-Normal.  Cardiovascular Auscultation:Rythm- Regular. Murmurs & Other Heart Sounds:Auscultation of the heart reveals- No Murmurs.  Abdomen Inspection:-Inspeection Normal. Palpation/Percussion:Note:No mass. Palpation and Percussion of the abdomen reveal- Non Tender, Non Distended + BS, no rebound or guarding.  Neurologic Cranial Nerve exam:- CN III-XII intact(No nystagmus), symmetric smile. Strength:- 5/5 equal and symmetric strength both upper and lower extremities.   Upper ext- on exam rt upper ext mild + phalen sign. Left side no phalen sign.    Assessment & Plan:   Patient Instructions  Fatigue and Snoring Concern for sleep apnea due to snoring and weight gain. Sleep study advised to evaluate apnea. - Refer to sleep pulmonologist for evaluation and potential sleep study. - Discuss home sleep study option with the sleep specialist.  Chronic Nasal  Congestion Chronic nasal congestion likely due to seasonal allergies, contributing to snoring and sleep disturbances. Fluticasone nasal spray discussed as effective with minimal side effects. - Prescribe fluticasone nasal spray. - Discuss potential allergen exposure and management options.  Palpitations Intermittent palpitations likely due to premature ventricular contractions. EKG showed normal sinus rhythm. High caffeine intake may contribute. - Order Zio patch for 14-day monitoring of cardiac rhythm. See if can find pvc or pac? - Refer to cardiologist for follow-up after Zio patch results. - Advise reducing caffeine intake.  Hypertension Blood pressure slightly elevated. Lifestyle modifications discussed as non-pharmacological management. - Monitor blood pressure regularly. - Discuss lifestyle modifications to manage blood pressure.  Carpal Tunnel Syndrome Numbness in hands likely due to carpal tunnel syndrome, possibly exacerbated by repetitive activities. - Recommend wrist splints to be worn at night. - Advise taking ibuprofen 200-400 mg every 8 hours as needed for inflammation. - Encourage rest and reduction of repetitive activities.  Follow  up date to be determined after lab review   Sylvia Everts, PA-C    Time spent with patient today was 45  minutes which consisted of chart review, discussing diagnosis, work up, treatment , answering pt questions and documentation.

## 2024-05-13 NOTE — Patient Instructions (Signed)
 Fatigue and Snoring Concern for sleep apnea due to snoring and weight gain. Sleep study advised to evaluate apnea. - Refer to sleep pulmonologist for evaluation and potential sleep study. - Discuss home sleep study option with the sleep specialist.  Chronic Nasal Congestion Chronic nasal congestion likely due to seasonal allergies, contributing to snoring and sleep disturbances. Fluticasone nasal spray discussed as effective with minimal side effects. - Prescribe fluticasone nasal spray. - Discuss potential allergen exposure and management options.  Palpitations Intermittent palpitations likely due to premature ventricular contractions. EKG showed normal sinus rhythm. High caffeine intake may contribute. - Order Zio patch for 14-day monitoring of cardiac rhythm. See if can find pvc or pac? - Refer to cardiologist for follow-up after Zio patch results. - Advise reducing caffeine intake.  Hypertension Blood pressure slightly elevated. Lifestyle modifications discussed as non-pharmacological management. - Monitor blood pressure regularly. - Discuss lifestyle modifications to manage blood pressure.  Carpal Tunnel Syndrome Numbness in hands likely due to carpal tunnel syndrome, possibly exacerbated by repetitive activities. - Recommend wrist splints to be worn at night. - Advise taking ibuprofen 200-400 mg every 8 hours as needed for inflammation. - Encourage rest and reduction of repetitive activities.  Follow up date to be determined after lab review

## 2024-05-18 ENCOUNTER — Other Ambulatory Visit (INDEPENDENT_AMBULATORY_CARE_PROVIDER_SITE_OTHER)

## 2024-05-18 DIAGNOSIS — Z125 Encounter for screening for malignant neoplasm of prostate: Secondary | ICD-10-CM

## 2024-05-18 DIAGNOSIS — R5383 Other fatigue: Secondary | ICD-10-CM

## 2024-05-18 NOTE — Addendum Note (Signed)
 Addended by: Marigene Shoulder on: 05/18/2024 08:02 AM   Modules accepted: Orders

## 2024-05-18 NOTE — Addendum Note (Signed)
 Addended by: Marigene Shoulder on: 05/18/2024 07:23 AM   Modules accepted: Orders

## 2024-05-19 ENCOUNTER — Ambulatory Visit: Payer: Self-pay | Admitting: Medical

## 2024-05-22 LAB — CBC WITH DIFFERENTIAL/PLATELET
Absolute Lymphocytes: 1758 {cells}/uL (ref 850–3900)
Absolute Monocytes: 517 {cells}/uL (ref 200–950)
Basophils Absolute: 28 {cells}/uL (ref 0–200)
Basophils Relative: 0.6 %
Eosinophils Absolute: 80 {cells}/uL (ref 15–500)
Eosinophils Relative: 1.7 %
HCT: 40.3 % (ref 38.5–50.0)
Hemoglobin: 13.5 g/dL (ref 13.2–17.1)
MCH: 30.9 pg (ref 27.0–33.0)
MCHC: 33.5 g/dL (ref 32.0–36.0)
MCV: 92.2 fL (ref 80.0–100.0)
MPV: 12.7 fL — ABNORMAL HIGH (ref 7.5–12.5)
Monocytes Relative: 11 %
Neutro Abs: 2317 {cells}/uL (ref 1500–7800)
Neutrophils Relative %: 49.3 %
Platelets: 176 10*3/uL (ref 140–400)
RBC: 4.37 10*6/uL (ref 4.20–5.80)
RDW: 12.7 % (ref 11.0–15.0)
Total Lymphocyte: 37.4 %
WBC: 4.7 10*3/uL (ref 3.8–10.8)

## 2024-05-22 LAB — VITAMIN B1: Vitamin B1 (Thiamine): 14 nmol/L (ref 8–30)

## 2024-05-22 LAB — COMPREHENSIVE METABOLIC PANEL WITH GFR
AG Ratio: 1.9 (calc) (ref 1.0–2.5)
ALT: 14 U/L (ref 9–46)
AST: 20 U/L (ref 10–35)
Albumin: 4.2 g/dL (ref 3.6–5.1)
Alkaline phosphatase (APISO): 68 U/L (ref 35–144)
BUN: 12 mg/dL (ref 7–25)
CO2: 30 mmol/L (ref 20–32)
Calcium: 8.8 mg/dL (ref 8.6–10.3)
Chloride: 104 mmol/L (ref 98–110)
Creat: 0.84 mg/dL (ref 0.70–1.30)
Globulin: 2.2 g/dL (ref 1.9–3.7)
Glucose, Bld: 72 mg/dL (ref 65–99)
Potassium: 4 mmol/L (ref 3.5–5.3)
Sodium: 140 mmol/L (ref 135–146)
Total Bilirubin: 0.9 mg/dL (ref 0.2–1.2)
Total Protein: 6.4 g/dL (ref 6.1–8.1)
eGFR: 103 mL/min/{1.73_m2} (ref >=60)

## 2024-05-22 LAB — T4, FREE: Free T4: 1.2 ng/dL (ref 0.8–1.8)

## 2024-05-22 LAB — PSA: PSA: 1.06 ng/mL (ref <=4.00)

## 2024-05-22 LAB — VITAMIN B12: Vitamin B-12: 285 pg/mL (ref 200–1100)

## 2024-05-22 LAB — TSH: TSH: 2.27 m[IU]/L (ref 0.40–4.50)

## 2024-05-24 NOTE — Addendum Note (Signed)
 Addended by: Serafina Damme on: 05/24/2024 08:36 PM   Modules accepted: Orders

## 2024-06-02 ENCOUNTER — Ambulatory Visit

## 2024-06-02 NOTE — Progress Notes (Deleted)
 Cardiology Consultation:    Date:  06/02/2024   ID:  Casey Carson, DOB 05/12/1971, MRN [id number]  PCP:  Dorina Loving, PA-C  Cardiologist:  Alean SAUNDERS Gibson Lad, MD   Referring MD: Saguier, Edward, PA-C   No chief complaint on file.    ASSESSMENT AND PLAN:   Casey Carson 53 year old male Problem List Items Addressed This Visit   None     History of Present Illness:    Casey Carson is a 53 y.o. male who is being seen today for the evaluation of patient's at the request of Saguier, Loving, PA-C.   Chronic history of palpitations which are brief, fatigue, snoring, chronic nasal congestion for which he uses nasal spray and Sudafed. Consumes up to 6 cups of coffee a day and drinks up to couple beers a week.   Referred here for further evaluation of palpitations, Zio patch 2 weeks study started 05/13/2024, no results available in the system at this time.  Past Medical History:  Diagnosis Date   GERD (gastroesophageal reflux disease)     No past surgical history on file.  Current Medications: No outpatient medications have been marked as taking for the 06/02/24 encounter (Appointment) with Toria Monte, Alean SAUNDERS, MD.     Allergies:   Patient has no known allergies.   Social History   Socioeconomic History   Marital status: Married    Spouse name: Not on file   Number of children: Not on file   Years of education: Not on file   Highest education level: Not on file  Occupational History   Not on file  Tobacco Use   Smoking status: Never   Smokeless tobacco: Never  Vaping Use   Vaping status: Never Used  Substance and Sexual Activity   Alcohol use: Yes    Alcohol/week: 2.0 standard drinks of alcohol    Types: 2 Cans of beer per week    Comment: on average a week.   Drug use: Never   Sexual activity: Yes  Other Topics Concern   Not on file  Social History Narrative   Not on file   Social Drivers of Health   Financial Resource  Strain: Not on file  Food Insecurity: Not on file  Transportation Needs: Not on file  Physical Activity: Not on file  Stress: Not on file  Social Connections: Not on file     Family History: The patient's family history is not on file. ROS:   Please see the history of present illness.    All 14 point review of systems negative except as described per history of present illness.  EKGs/Labs/Other Studies Reviewed:    The following studies were reviewed today:   EKG:       Recent Labs: 05/18/2024: ALT 14; BUN 12; Creat 0.84; Hemoglobin 13.5; Platelets 176; Potassium 4.0; Sodium 140; TSH 2.27  Recent Lipid Panel    Component Value Date/Time   CHOL 188 12/26/2021 1620   TRIG 180.0 (H) 12/26/2021 1620   HDL 47.40 12/26/2021 1620   CHOLHDL 4 12/26/2021 1620   VLDL 36.0 12/26/2021 1620   LDLCALC 104 (H) 12/26/2021 1620    Physical Exam:    VS:  There were no vitals taken for this visit.    Wt Readings from Last 3 Encounters:  05/13/24 248 lb (112.5 kg)  06/11/23 236 lb (107 kg)  05/30/23 239 lb (108.4 kg)     GENERAL:  Well nourished, well developed in no acute distress NECK: No  JVD; No carotid bruits CARDIAC: RRR, S1 and S2 present, no murmurs, no rubs, no gallops CHEST:  Clear to auscultation without rales, wheezing or rhonchi  Extremities: No pitting pedal edema. Pulses bilaterally symmetric with radial 2+ and dorsalis pedis 2+ NEUROLOGIC:  Alert and oriented x 3  Medication Adjustments/Labs and Tests Ordered: Current medicines are reviewed at length with the patient today.  Concerns regarding medicines are outlined above.  No orders of the defined types were placed in this encounter.  No orders of the defined types were placed in this encounter.   Signed, Alean jess Kobus, MD, MPH, [facility]. 06/02/2024 2:33 PM    [facility]

## 2024-06-21 DIAGNOSIS — R002 Palpitations: Secondary | ICD-10-CM

## 2024-07-16 NOTE — Addendum Note (Signed)
 Addended by: DORINA DALLAS HERO on: 07/16/2024 09:57 PM   Modules accepted: Orders

## 2024-07-21 ENCOUNTER — Encounter: Payer: Self-pay | Admitting: Pulmonary Disease

## 2024-07-21 ENCOUNTER — Ambulatory Visit: Admitting: Pulmonary Disease

## 2024-07-21 ENCOUNTER — Encounter: Payer: Self-pay | Admitting: Medical

## 2024-07-21 VITALS — BP 114/80 | HR 109 | Temp 97.3°F | Ht 77.0 in | Wt 249.2 lb

## 2024-07-21 DIAGNOSIS — R5383 Other fatigue: Secondary | ICD-10-CM | POA: Diagnosis not present

## 2024-07-21 DIAGNOSIS — G4733 Obstructive sleep apnea (adult) (pediatric): Secondary | ICD-10-CM

## 2024-07-21 DIAGNOSIS — R0683 Snoring: Secondary | ICD-10-CM | POA: Diagnosis not present

## 2024-07-21 DIAGNOSIS — J069 Acute upper respiratory infection, unspecified: Secondary | ICD-10-CM | POA: Diagnosis not present

## 2024-07-21 NOTE — Progress Notes (Signed)
 Casey Carson    [id number]    [birth year]  Primary Care Physician:Saguier, Dallas RIGGERS  Referring Physician: Dorina Dallas, PA-C [id number] [place],  [place] [address]  Chief complaint: Consult for sleep apnea evaluation  HPI: 53 y.o. who  has a past medical history of GERD (gastroesophageal reflux disease).  Discussed the use of AI scribe software for clinical note transcription with the patient, who gave verbal consent to proceed.  History of Present Illness Casey Carson is a 53 year old male with non-sustained ventricular tachycardia who presents with fatigue and snoring for evaluation of sleep apnea. He was referred by Dallas Peabody for evaluation of fatigue and snoring  Fatigue - Significant fatigue present for several months - Impaired ability to focus and work effectively - Uncertain etiology - No prior evaluation for sleep apnea  Sleep-disordered breathing symptoms - Snoring present, uncertain extent - No shortness of breath, cough, or wheezing except during a recent sinus cold - Nocturnal symptoms not clearly described - No known exposure to triggers from pet cat or feather pillows  Cardiac arrhythmia - Non-sustained ventricular tachycardia - Zio patch results forwarded to MyChart - No palpitations at this visit  Recent upper respiratory symptoms - Recent sinus cold - Currently taking medication for sinus symptoms   Relevant Pulmonary history: Pets: No pets Occupation: Works as a Warden/ranger Exposures: No mold, hot tub, Jacuzzi.  No feather pillows or comforter No h/o chemo/XRT/amiodarone/macrodantin/MTX  No exposure to asbestos, silica or other organic allergens  Smoking history: Never smoker Travel history: Previously lived in [place]  and in [place]. Family history: No family history of lung disease  Outpatient Encounter Medications as of 07/21/2024  Medication Sig   COVID-19 mRNA bivalent  vaccine, Pfizer, (PFIZER COVID-19 VAC BIVALENT) injection Inject into the muscle.   fluticasone  (FLONASE ) 50 MCG/ACT nasal spray Place 2 sprays into both nostrils daily.   Glucosamine-Chondroit-Vit C-Mn (GLUCOSAMINE 1500 COMPLEX PO) Take by mouth.   Lactobacillus-Inulin (CULTURELLE ADULT ULT BALANCE PO) Take by mouth.   magnesium gluconate (MAGONATE) 500 MG tablet Take 500 mg by mouth 2 (two) times daily.   Multiple Vitamin (MULTIVITAMIN) tablet Take 1 tablet by mouth daily.   No facility-administered encounter medications on file as of 07/21/2024.     Physical Exam: Today's Vitals   07/21/24 1546  BP: 114/80  Pulse: (!) 109  Temp: (!) 97.3 F (36.3 C)  TempSrc: Temporal  SpO2: 95%  Weight: 249 lb 3.2 oz (113 kg)  Height: 6' 5 (1.956 m)   There is no height or weight on file to calculate BMI.  Physical Exam GEN: No acute distress CV: Regular rate and rhythm no murmurs LUNGS: Clear to auscultation bilaterally normal respiratory effort SKIN JOINTS: Warm and dry no rash    Data Reviewed: Imaging:   PFTs:  Labs:  Assessment & Plan Suspected obstructive sleep apnea Daytime fatigue for several months and snoring suggestive of obstructive sleep apnea. No prior evaluation for sleep apnea. No significant lung symptoms reported, and no smoking or family history of lung disease. - Order home sleep study to evaluate for obstructive sleep apnea.  Acute upper respiratory infection (sinus cold) Recent onset of sinus cold with associated cough. No significant respiratory symptoms such as shortness of breath, congestion, or wheezing. Symptoms managed with over-the-counter medication.  Palpitations, NSVT Diagnosed on ZIO monitor.  Referred to cardiology by primary care but has not seen them yet Will  alert primary care for follow-up  Recommendations: Home sleep study  Lonna Coder MD [facility] 07/21/2024, 3:43 PM  CC: Dorina Loving, PA-C

## 2024-07-21 NOTE — Patient Instructions (Signed)
  VISIT SUMMARY: Today, you were seen for fatigue and snoring, which may be related to sleep apnea. You were also evaluated for a recent sinus cold and your history of non-sustained ventricular tachycardia was reviewed.  YOUR PLAN: -SUSPECTED OBSTRUCTIVE SLEEP APNEA: Obstructive sleep apnea is a condition where your breathing stops and starts during sleep, often leading to fatigue and snoring. To evaluate this, we have ordered a home sleep study for you.  -ACUTE UPPER RESPIRATORY INFECTION (SINUS COLD): A sinus cold is an infection that causes symptoms like a runny nose, cough, and congestion. You are currently managing these symptoms with over-the-counter medication.  INSTRUCTIONS: Please complete the home sleep study as ordered. Follow up with your primary care physician to discuss the results and any further steps. If your sinus symptoms worsen or do not improve, please contact our office. We will also alert the primary care about following up on the cardiology referral.

## 2024-08-04 ENCOUNTER — Ambulatory Visit (HOSPITAL_COMMUNITY)
Admission: RE | Admit: 2024-08-04 | Discharge: 2024-08-04 | Disposition: A | Discharge status: Home / Self Care | Payer: Self-pay | Source: Ambulatory Visit | Attending: Cardiology | Admitting: Cardiology

## 2024-08-04 ENCOUNTER — Ambulatory Visit: Attending: Cardiovascular Disease | Admitting: Cardiology

## 2024-08-04 VITALS — BP 144/84 | HR 71 | Ht 76.5 in | Wt 248.4 lb

## 2024-08-04 DIAGNOSIS — R0602 Shortness of breath: Secondary | ICD-10-CM | POA: Diagnosis not present

## 2024-08-04 DIAGNOSIS — R002 Palpitations: Secondary | ICD-10-CM | POA: Diagnosis not present

## 2024-08-04 DIAGNOSIS — R9431 Abnormal electrocardiogram [ECG] [EKG]: Secondary | ICD-10-CM | POA: Insufficient documentation

## 2024-08-04 NOTE — Patient Instructions (Signed)
 Medication Instructions:  The current medical regimen is effective;  continue present plan and medications.  *If you need a refill on your cardiac medications before your next appointment, please call your pharmacy*  Testing/Procedures: Your physician has requested that you have an echocardiogram. Echocardiography is a painless test that uses sound waves to create images of your heart. It provides your doctor with information about the size and shape of your heart and how well your heart's chambers and valves are working. This procedure takes approximately one hour. There are no restrictions for this procedure. Please do NOT wear cologne, perfume, aftershave, or lotions (deodorant is allowed). Please arrive 15 minutes prior to your appointment time.  Please note: We ask at that you not bring children with you during ultrasound (echo/ vascular) testing. Due to room size and safety concerns, children are not allowed in the ultrasound rooms during exams. Our front office staff cannot provide observation of children in our lobby area while testing is being conducted. An adult accompanying a patient to their appointment will only be allowed in the ultrasound room at the discretion of the ultrasound technician under special circumstances. We apologize for any inconvenience.  Your physician has requested that you have a Coronary Calcium score which is completed by CT. Cardiac computed tomography (CT) is a painless test that uses an x-ray machine to take clear, detailed pictures of your heart. There are no instructions for this testing.  You may eat/drink and take your normal medications this day.  The cost of the testing is $99 due at the time of your appointment. Report to 2nd floor today for this testing.  Follow-Up: At [facility], you and your health needs are our priority.  As part of our continuing mission to provide you with exceptional heart care, our providers are all part of one team.   This team includes your primary Cardiologist (physician) and Advanced Practice Providers or APPs (Physician Assistants and Nurse Practitioners) who all work together to provide you with the care you need, when you need it.  Your next appointment:   Follow up will be based on  the results of the above testing.   We recommend signing up for the patient portal called MyChart.  Sign up information is provided on this After Visit Summary.  MyChart is used to connect with patients for Virtual Visits (Telemedicine).  Patients are able to view lab/test results, encounter notes, upcoming appointments, etc.  Non-urgent messages can be sent to your provider as well.   To learn more about what you can do with MyChart, go to [email or web address].

## 2024-08-04 NOTE — Progress Notes (Signed)
 Cardiology Office Note:  .   Date:  08/04/2024  ID:  Cassondra Alm Hibbs, DOB [birth year], MRN [id number] PCP: Dorina Dallas RIGGERS  [facility] Providers Cardiologist:  Oneil Parchment, MD    History of Present Illness: .   Issai Vada Yellen is a 53 y.o. male Discussed the use of AI scribe software for clinical note transcription with the patient, who gave verbal consent to proceed.  History of Present Illness Krishay Obbie Lewallen is a 53 year old male who presents with heart palpitations and fatigue. He was referred by his general practitioner for evaluation of heart palpitations.  He experiences heart palpitations characterized by sensations of his heart skipping a beat or racing for a few seconds before returning to normal. These episodes occur occasionally and were captured on a Zio monitor worn in July. The patient was told that the monitor recorded some brief episodes, but he does not recall all of them.  He also experiences persistent fatigue, often waking up still feeling tired. This has led to a referral to a pulmonologist and undergoing a sleep study. He feels more winded than usual with exertion, such as climbing stairs at [employer] earlier today.  In terms of family history, his mother has experienced micro strokes, and his uncle died of a sudden heart attack in his fifties. He is concerned about his own heart health, given his age and family history.  He denies being a smoker and notes that his uncle, who had a heart attack, quit smoking in his thirties.     Studies Reviewed: SABRA   EKG Interpretation Date/Time:  Wednesday August 04 2024 14:45:24 EDT Ventricular Rate:  71 PR Interval:  138 QRS Duration:  98 QT Interval:  368 QTC Calculation: 399 R Axis:   -1  Text Interpretation: Normal sinus rhythm with sinus arrhythmia Normal ECG When compared with ECG of 25-Jan-2023 03:26, No significant change since last tracing Confirmed by Parchment Oneil ([id number])  on 08/04/2024 2:56:41 PM    Results DIAGNOSTIC Zio monitor: Heart rate average 74 bpm, sinus rhythm, no atrial fibrillation, 14 nonsustained supraventricular tachycardia (SVT) episodes, likely paroxysmal atrial tachycardia lasting 15 beats, rare SVT and ventricular ectopy, no sustained arrhythmias (06/2024) Risk Assessment/Calculations:           Physical Exam:   VS:  BP (!) 144/84   Pulse 71   Ht 6' 4.5 (1.943 m)   Wt 248 lb 6.4 oz (112.7 kg)   SpO2 95%   BMI 29.84 kg/m    Wt Readings from Last 3 Encounters:  08/04/24 248 lb 6.4 oz (112.7 kg)  07/21/24 249 lb 3.2 oz (113 kg)  05/13/24 248 lb (112.5 kg)    GEN: Well nourished, well developed in no acute distress NECK: No JVD; No carotid bruits CARDIAC: RRR, no murmurs, no rubs, no gallops RESPIRATORY:  Clear to auscultation without rales, wheezing or rhonchi  ABDOMEN: Soft, non-tender, non-distended EXTREMITIES:  No edema; No deformity   Zio HR 42 to 176, average 74. 14 nonsustained SVT lasting 15 beats. Rare supraventricular and ventricular ectopy. No sustained arrhythmias. No atrial fibrillation.    ASSESSMENT AND PLAN: .    Assessment and Plan Assessment & Plan Paroxysmal supraventricular tachycardia and palpitations Intermittent episodes of paroxysmal supraventricular tachycardia (SVT) and palpitations recorded on Zio monitor. Episodes are brief, self-resolving, and without sustained arrhythmias. While common and generally benign, he could rarely indicate underlying cardiac issues. - Order echocardiogram to evaluate heart valves and pump function.  Exertional  dyspnea Reports increased dyspnea during exertion, such as stair climbing, with no dyspnea at rest. Differential includes potential cardiac causes. - Order coronary calcium score CT scan $99 to assess for calcified plaque in coronary arteries.  Fatigue Persistent fatigue with non-restorative sleep. Undergoing evaluation by pulmonology with a sleep study to  assess for potential sleep-related causes.  Goals of Care Discussed financial considerations of recommended tests. CT scan costs $99 and is not covered by insurance. Echocardiogram will be processed through insurance, with costs varying. He expressed concern about expenses but has an HSA to cover potential costs.         Dispo:   Signed, Oneil Parchment, MD

## 2024-08-06 ENCOUNTER — Ambulatory Visit: Payer: Self-pay | Admitting: Cardiology

## 2024-09-10 ENCOUNTER — Ambulatory Visit (HOSPITAL_COMMUNITY): Admission: RE | Admit: 2024-09-10

## 2024-09-10 ENCOUNTER — Encounter (HOSPITAL_COMMUNITY): Payer: Self-pay

## 2024-09-10 ENCOUNTER — Ambulatory Visit (HOSPITAL_COMMUNITY)
Admission: RE | Admit: 2024-09-10 | Discharge: 2024-09-10 | Disposition: A | Discharge status: Home / Self Care | Source: Ambulatory Visit | Attending: Cardiology | Admitting: Cardiology

## 2024-09-10 DIAGNOSIS — R9431 Abnormal electrocardiogram [ECG] [EKG]: Secondary | ICD-10-CM | POA: Diagnosis not present

## 2024-09-10 DIAGNOSIS — R0602 Shortness of breath: Secondary | ICD-10-CM | POA: Diagnosis not present

## 2024-09-10 DIAGNOSIS — R002 Palpitations: Secondary | ICD-10-CM | POA: Diagnosis not present

## 2024-09-10 LAB — ECHOCARDIOGRAM COMPLETE
Area-P 1/2: 3.21 cm2
S' Lateral: 3.3 cm

## 2024-09-10 MED ORDER — PERFLUTREN LIPID MICROSPHERE
1.0000 mL | INTRAVENOUS | Status: AC | PRN
  Administered 2024-09-10: 2 mL via INTRAVENOUS
